# Patient Record
Sex: Male | Born: 1980 | Race: Asian | Hispanic: No | Marital: Married | Smoking: Former smoker
Health system: Southern US, Community
[De-identification: ages and names within clinical notes are randomized; demographics above are authoritative.]

## PROBLEM LIST (undated history)

## (undated) DIAGNOSIS — I517 Cardiomegaly: Secondary | ICD-10-CM

## (undated) DIAGNOSIS — R51 Headache: Secondary | ICD-10-CM

## (undated) DIAGNOSIS — R12 Heartburn: Secondary | ICD-10-CM

## (undated) DIAGNOSIS — K219 Gastro-esophageal reflux disease without esophagitis: Secondary | ICD-10-CM

## (undated) HISTORY — DX: Cardiomegaly: I51.7

---

## 2011-07-05 ENCOUNTER — Emergency Department (HOSPITAL_COMMUNITY)
Admission: EM | Admit: 2011-07-05 | Discharge: 2011-07-06 | Discharge status: Against Medical Advice | Payer: Self-pay | Attending: Emergency Medicine | Admitting: Emergency Medicine

## 2011-07-05 DIAGNOSIS — R12 Heartburn: Secondary | ICD-10-CM | POA: Insufficient documentation

## 2012-04-11 ENCOUNTER — Observation Stay (HOSPITAL_COMMUNITY)
Admission: EM | Admit: 2012-04-11 | Discharge: 2012-04-13 | Disposition: A | Discharge status: Home / Self Care | Payer: BC Managed Care – PPO | Source: Ambulatory Visit | Attending: Surgery | Admitting: Surgery

## 2012-04-11 ENCOUNTER — Encounter (HOSPITAL_COMMUNITY): Payer: Self-pay | Admitting: Emergency Medicine

## 2012-04-11 ENCOUNTER — Encounter (HOSPITAL_COMMUNITY): Payer: Self-pay | Admitting: *Deleted

## 2012-04-11 ENCOUNTER — Emergency Department (HOSPITAL_COMMUNITY): Payer: BC Managed Care – PPO

## 2012-04-11 ENCOUNTER — Emergency Department (HOSPITAL_COMMUNITY)
Admission: EM | Admit: 2012-04-11 | Discharge: 2012-04-11 | Disposition: A | Discharge status: Home / Self Care | Payer: BC Managed Care – PPO | Attending: Emergency Medicine | Admitting: Emergency Medicine

## 2012-04-11 DIAGNOSIS — F17200 Nicotine dependence, unspecified, uncomplicated: Secondary | ICD-10-CM | POA: Insufficient documentation

## 2012-04-11 DIAGNOSIS — K2970 Gastritis, unspecified, without bleeding: Secondary | ICD-10-CM | POA: Insufficient documentation

## 2012-04-11 DIAGNOSIS — Z23 Encounter for immunization: Secondary | ICD-10-CM | POA: Insufficient documentation

## 2012-04-11 DIAGNOSIS — K219 Gastro-esophageal reflux disease without esophagitis: Secondary | ICD-10-CM

## 2012-04-11 DIAGNOSIS — K2990 Gastroduodenitis, unspecified, without bleeding: Secondary | ICD-10-CM | POA: Insufficient documentation

## 2012-04-11 DIAGNOSIS — K8066 Calculus of gallbladder and bile duct with acute and chronic cholecystitis without obstruction: Secondary | ICD-10-CM

## 2012-04-11 DIAGNOSIS — R51 Headache: Secondary | ICD-10-CM | POA: Insufficient documentation

## 2012-04-11 DIAGNOSIS — K810 Acute cholecystitis: Principal | ICD-10-CM | POA: Insufficient documentation

## 2012-04-11 DIAGNOSIS — K8001 Calculus of gallbladder with acute cholecystitis with obstruction: Secondary | ICD-10-CM | POA: Diagnosis present

## 2012-04-11 DIAGNOSIS — K8050 Calculus of bile duct without cholangitis or cholecystitis without obstruction: Secondary | ICD-10-CM

## 2012-04-11 HISTORY — DX: Heartburn: R12

## 2012-04-11 HISTORY — DX: Gastro-esophageal reflux disease without esophagitis: K21.9

## 2012-04-11 HISTORY — DX: Headache: R51

## 2012-04-11 LAB — DIFFERENTIAL
Basophils Absolute: 0.1 K/uL (ref 0.0–0.1)
Basophils Relative: 0 % (ref 0–1)
Eosinophils Absolute: 0.1 K/uL (ref 0.0–0.7)
Eosinophils Relative: 1 % (ref 0–5)
Lymphocytes Relative: 20 % (ref 12–46)
Lymphs Abs: 2.5 K/uL (ref 0.7–4.0)
Monocytes Absolute: 0.8 10*3/uL (ref 0.1–1.0)
Monocytes Relative: 6 % (ref 3–12)
Neutro Abs: 9.1 10*3/uL — ABNORMAL HIGH (ref 1.7–7.7)
Neutrophils Relative %: 72 % (ref 43–77)

## 2012-04-11 LAB — CBC
HCT: 45.9 % (ref 39.0–52.0)
Hemoglobin: 15.9 g/dL (ref 13.0–17.0)
MCH: 29.8 pg (ref 26.0–34.0)
MCHC: 34.6 g/dL (ref 30.0–36.0)
MCV: 86.1 fL (ref 78.0–100.0)
Platelets: 237 K/uL (ref 150–400)
RBC: 5.33 MIL/uL (ref 4.22–5.81)
RDW: 13 % (ref 11.5–15.5)
WBC: 12.5 10*3/uL — ABNORMAL HIGH (ref 4.0–10.5)

## 2012-04-11 LAB — URINALYSIS, ROUTINE W REFLEX MICROSCOPIC
Bilirubin Urine: NEGATIVE
Glucose, UA: NEGATIVE mg/dL
Hgb urine dipstick: NEGATIVE
Ketones, ur: NEGATIVE mg/dL
Leukocytes, UA: NEGATIVE
Nitrite: NEGATIVE
Protein, ur: NEGATIVE mg/dL
Specific Gravity, Urine: 1.015 (ref 1.005–1.030)
Urobilinogen, UA: 0.2 mg/dL (ref 0.0–1.0)
pH: 7 (ref 5.0–8.0)

## 2012-04-11 LAB — COMPREHENSIVE METABOLIC PANEL WITH GFR
ALT: 44 U/L (ref 0–53)
AST: 22 U/L (ref 0–37)
Albumin: 4.3 g/dL (ref 3.5–5.2)
Alkaline Phosphatase: 56 U/L (ref 39–117)
Potassium: 3.9 meq/L (ref 3.5–5.1)
Sodium: 140 meq/L (ref 135–145)
Total Protein: 7.3 g/dL (ref 6.0–8.3)

## 2012-04-11 LAB — COMPREHENSIVE METABOLIC PANEL
BUN: 10 mg/dL (ref 6–23)
CO2: 28 mEq/L (ref 19–32)
Calcium: 9.6 mg/dL (ref 8.4–10.5)
Chloride: 101 mEq/L (ref 96–112)
Creatinine, Ser: 0.78 mg/dL (ref 0.50–1.35)
GFR calc Af Amer: >90 mL/min (ref >90)
GFR calc non Af Amer: >90 mL/min (ref >90)
Glucose, Bld: 110 mg/dL — ABNORMAL HIGH (ref 70–99)
Total Bilirubin: 0.6 mg/dL (ref 0.3–1.2)

## 2012-04-11 LAB — LIPASE, BLOOD: Lipase: 18 U/L (ref 11–59)

## 2012-04-11 LAB — SURGICAL PCR SCREEN: MRSA, PCR: NEGATIVE

## 2012-04-11 MED ORDER — FAMOTIDINE 20 MG PO TABS: 20.0000 mg | ORAL_TABLET | Freq: Two times a day (BID) | ORAL | Status: DC

## 2012-04-11 MED ORDER — HYDROMORPHONE HCL PF 1 MG/ML IJ SOLN
0.5000 mg | INTRAMUSCULAR | Status: DC | PRN
  Administered 2012-04-11 – 2012-04-12 (×3): 1 mg via INTRAVENOUS
  Filled 2012-04-11 (×3): qty 1

## 2012-04-11 MED ORDER — CHLORHEXIDINE GLUCONATE 0.12 % MT SOLN
15.0000 mL | Freq: Two times a day (BID) | OROMUCOSAL | Status: DC
  Administered 2012-04-12 – 2012-04-13 (×3): 15 mL via OROMUCOSAL
  Filled 2012-04-11 (×3): qty 15

## 2012-04-11 MED ORDER — BIOTENE DRY MOUTH MT LIQD
15.0000 mL | Freq: Two times a day (BID) | OROMUCOSAL | Status: DC
  Administered 2012-04-12: 15 mL via OROMUCOSAL

## 2012-04-11 MED ORDER — PANTOPRAZOLE SODIUM 40 MG PO TBEC
40.0000 mg | DELAYED_RELEASE_TABLET | Freq: Once | ORAL | Status: AC
  Administered 2012-04-11: 40 mg via ORAL
  Filled 2012-04-11: qty 1

## 2012-04-11 MED ORDER — FAMOTIDINE 20 MG PO TABS
20.0000 mg | ORAL_TABLET | Freq: Once | ORAL | Status: AC
  Administered 2012-04-11: 20 mg via ORAL
  Filled 2012-04-11: qty 1

## 2012-04-11 MED ORDER — HYDROMORPHONE HCL PF 1 MG/ML IJ SOLN
0.5000 mg | Freq: Once | INTRAMUSCULAR | Status: AC
  Administered 2012-04-11: 0.5 mg via INTRAVENOUS
  Filled 2012-04-11: qty 1

## 2012-04-11 MED ORDER — SODIUM CHLORIDE 0.9 % IV SOLN: 3.0000 g | Freq: Four times a day (QID) | INTRAVENOUS | Status: DC

## 2012-04-11 MED ORDER — SODIUM CHLORIDE 0.9 % IV SOLN
Freq: Once | INTRAVENOUS | Status: AC
  Administered 2012-04-11: 75 mL/h via INTRAVENOUS

## 2012-04-11 MED ORDER — ONDANSETRON HCL 4 MG/2ML IJ SOLN
4.0000 mg | Freq: Three times a day (TID) | INTRAMUSCULAR | Status: DC | PRN
  Administered 2012-04-11: 4 mg via INTRAVENOUS

## 2012-04-11 MED ORDER — HYDROMORPHONE HCL PF 1 MG/ML IJ SOLN: 1.0000 mg | INTRAMUSCULAR | Status: DC | PRN

## 2012-04-11 MED ORDER — PANTOPRAZOLE SODIUM 40 MG IV SOLR
40.0000 mg | Freq: Every day | INTRAVENOUS | Status: DC
  Administered 2012-04-11 – 2012-04-12 (×2): 40 mg via INTRAVENOUS
  Filled 2012-04-11 (×3): qty 40

## 2012-04-11 MED ORDER — PANTOPRAZOLE SODIUM 20 MG PO TBEC: 20.0000 mg | DELAYED_RELEASE_TABLET | Freq: Every day | ORAL | Status: DC

## 2012-04-11 MED ORDER — KCL IN DEXTROSE-NACL 20-5-0.45 MEQ/L-%-% IV SOLN
INTRAVENOUS | Status: DC
  Administered 2012-04-11 – 2012-04-12 (×2): via INTRAVENOUS
  Filled 2012-04-11 (×6): qty 1000

## 2012-04-11 MED ORDER — AMPICILLIN-SULBACTAM SODIUM 3 (2-1) G IJ SOLR
3.0000 g | Freq: Four times a day (QID) | INTRAMUSCULAR | Status: DC
  Administered 2012-04-11 – 2012-04-13 (×7): 3 g via INTRAVENOUS
  Filled 2012-04-11 (×10): qty 3

## 2012-04-11 MED ORDER — ONDANSETRON HCL 4 MG/2ML IJ SOLN
INTRAMUSCULAR | Status: AC
  Filled 2012-04-11: qty 2

## 2012-04-11 MED ORDER — HYDROMORPHONE HCL PF 1 MG/ML IJ SOLN
0.5000 mg | INTRAMUSCULAR | Status: DC | PRN
  Administered 2012-04-11: 0.5 mg via INTRAVENOUS
  Filled 2012-04-11: qty 1

## 2012-04-11 MED ORDER — ACETAMINOPHEN 325 MG PO TABS: 650.0000 mg | ORAL_TABLET | Freq: Four times a day (QID) | ORAL | Status: DC | PRN

## 2012-04-11 MED ORDER — GI COCKTAIL ~~LOC~~
30.0000 mL | Freq: Once | ORAL | Status: AC
  Administered 2012-04-11: 30 mL via ORAL
  Filled 2012-04-11: qty 30

## 2012-04-11 MED ORDER — PNEUMOCOCCAL VAC POLYVALENT 25 MCG/0.5ML IJ INJ
0.5000 mL | INJECTION | INTRAMUSCULAR | Status: AC
  Administered 2012-04-12: 0.5 mL via INTRAMUSCULAR
  Filled 2012-04-11: qty 0.5

## 2012-04-11 MED ORDER — ACETAMINOPHEN 650 MG RE SUPP: 650.0000 mg | Freq: Four times a day (QID) | RECTAL | Status: DC | PRN

## 2012-04-11 NOTE — ED Notes (Signed)
Pt claimed that his pain is better 

## 2012-04-11 NOTE — Discharge Instructions (Signed)
Please follow the diet care instructions attached. Take Pepcid twice a day for 10 days then every night. Take Protonix once a day at night. See the followup list above for the gastroenterologist should your symptoms continue. If you develop severe or worsening symptoms or pain or vomiting return to the hospital for a repeat evaluation.

## 2012-04-11 NOTE — ED Provider Notes (Signed)
History    This chart was scribed for Gavin Pound. Oletta Lamas, MD, MD by Smitty Pluck. The patient was seen in room STRE1 and the patient's care was started at 1:17AM.   CSN: [id number]  Arrival date & time 04/11/12  1220   First MD Initiated Contact with Patient 04/11/12 1242      Chief Complaint  Patient presents with  . Gastrophageal Reflux    (Consider location/radiation/quality/duration/timing/severity/associated sxs/prior treatment) The history is provided by the patient.   Steve Cooke is a 31 y.o. male who presents to the Emergency Department complaining of moderate intermittent gastrophageal reflex onset 1 day ago at [facility]. Pt reports that pain started 2 hours after eating (meal was at [employer]). He was in ED earlier this AM and was given GI cocktail without relief. Denies any abdominal surgeries. Pt reports hx of chronic reflex symptoms for past year. There is radiating to right upper back. Denies vomiting and fever.    Past Medical History  Diagnosis Date  . GERD (gastroesophageal reflux disease)   . Heartburn     History reviewed. No pertinent past surgical history.  History reviewed. No pertinent family history.  History  Substance Use Topics  . Smoking status: Current Everyday Smoker  . Smokeless tobacco: Not on file  . Alcohol Use: No      Review of Systems  Constitutional: Negative for fever, chills and fatigue.  Respiratory: Negative for cough and shortness of breath.   Cardiovascular: Negative for chest pain.  Gastrointestinal: Positive for abdominal pain. Negative for nausea, vomiting, diarrhea, constipation and blood in stool.  Genitourinary: Negative for dysuria, frequency, hematuria, enuresis and difficulty urinating.  All other systems reviewed and are negative.    Allergies  Review of patient's allergies indicates no known allergies.  Home Medications   Current Outpatient Rx  Name Route Sig Dispense Refill  . CALCIUM CARBONATE ANTACID 500 MG PO  CHEW Oral Chew 2 tablets by mouth every 8 (eight) hours as needed. Indigestion    . FAMOTIDINE 20 MG PO TABS Oral Take 1 tablet (20 mg total) by mouth 2 (two) times daily. 30 tablet 0  . NAPROXEN SODIUM 220 MG PO TABS Oral Take 440 mg by mouth daily as needed. For pain    . PANTOPRAZOLE SODIUM 20 MG PO TBEC Oral Take 1 tablet (20 mg total) by mouth daily. 30 tablet 1    BP 124/73  Pulse 60  Temp(Src) 98.2 F (36.8 C) (Oral)  Resp 18  SpO2 100%  Physical Exam  Nursing note and vitals reviewed. Constitutional: He is oriented to person, place, and time. He appears well-developed. No distress.  HENT:  Head: Normocephalic and atraumatic.  Eyes: Conjunctivae are normal. Pupils are equal, round, and reactive to light.  Neck: Normal range of motion. Neck supple.  Cardiovascular: Normal rate.   Pulmonary/Chest: Effort normal. No respiratory distress.  Abdominal: Soft. Normal appearance. There is tenderness (epigastric and RUQ) in the right upper quadrant and epigastric area. There is guarding. There is no rigidity, no rebound and no CVA tenderness.  Neurological: He is alert and oriented to person, place, and time.  Skin: Skin is warm and dry.  Psychiatric: He has a normal mood and affect. His behavior is normal.    ED Course  Procedures (including critical care time) DIAGNOSTIC STUDIES: Oxygen Saturation is 99% on room air, normal by my interpretation.    COORDINATION OF CARE: 1:24PM EDP discusses pt ED treatment with pt. Ordered medication: 0.9% NaCl infusion  Labs Reviewed  CBC - Abnormal; Notable for the following:    WBC 12.5 (*)    All other components within normal limits  DIFFERENTIAL - Abnormal; Notable for the following:    Neutro Abs 9.1 (*)    All other components within normal limits  COMPREHENSIVE METABOLIC PANEL - Abnormal; Notable for the following:    Glucose, Bld 110 (*)    All other components within normal limits  LIPASE, BLOOD  URINALYSIS, ROUTINE W  REFLEX MICROSCOPIC   US Abdomen Complete  04/11/2012  *RADIOLOGY REPORT*  Clinical Data:  Abdominal pain.  Gastroesophageal reflux.  The  COMPLETE ABDOMINAL ULTRASOUND  Comparison:  None.  Findings:  Gallbladder:  Shadowing echogenic stones measure up to 1.5 cm in the gallbladder neck.  Wall measures 6 mm.  No sonographic Murphy's sign.  Common bile duct:  4 mm, within normal limits.  Liver:  Left hepatic lobe is poorly visualized.  Remainder of the liver appears somewhat heterogeneous and coarsened.  IVC:  Appears normal.  Pancreas:  Obscured by bowel gas.  Spleen:  6.9 cm, negative.  Right Kidney:  11.1 cm with normal parenchymal echogenicity.  No hydronephrosis.  Left Kidney:  12.0 cm.  Normal parenchymal echogenicity.  No hydronephrosis.  A 2.3 x 1.8 x 2.1 cm anechoic lesion with increased through transmission is seen in the upper pole renal sinus and is most consistent with a cyst.  Abdominal aorta:  Proximal abdominal aorta measures 2.2 cm. Remainder of the abdominal aorta is obscured by bowel gas.  IMPRESSION:  1.  Midline structures are poorly visualized due to body habitus. 2.  Cholelithiasis and wall thickening.  Chronic cholecystitis could have this appearance. 3.  Suspect hepatic steatosis.  Original Report Authenticated By: Reyes Ivan, M.D.     1. Biliary colic     3:23 PM I reviewed U/S myself.  Pt  Is better, but still tender in upper epigastrium without overt Murphy's sign.  WBC is 12.5, not toxic appearing, but he is interested in having surgery to alleviate symptoms.  Pain has been present since 8 PM last night.  Plan is to consult general surgery to see pt.  MDM  I personally performed the services described in this documentation, which was scribed in my presence. The recorded information has been reviewed and considered.    Pt with tenderness in epigastrium and RUQ.  Constant since last night after eating Hort[employer]al.  Pt may have biliary colic, doesn't appear toxic,  will place in CDU for holding to obtain labs, U/S.  Likely will either need gsurg referral or consultation pending labs and U/S result.  Pt already has meds for GERD at home.  I reviewed his ED chart from earlier this AM.         Gavin Pound. Jamaine Quintin, MD 04/11/12 1525

## 2012-04-11 NOTE — ED Notes (Signed)
MD at bedside (general surgery) 

## 2012-04-11 NOTE — ED Notes (Signed)
MD Miller at bedside. 

## 2012-04-11 NOTE — H&P (Addendum)
Steve Cooke is an 31 y.o. male.   Chief Complaint: Return visit to the ED after discharge for reflux.  US shows thickened GB wall with stone HPI: The patient has had attacks previously and attributed them to reflux symptoms.  He was seen in the ED early this AM, sent home after evaluation demonstrated reflux symptoms.  Returned and US done which showed cholelithiasis and thickened GB wall.  Past Medical History  Diagnosis Date  . GERD (gastroesophageal reflux disease)   . Heartburn     History reviewed. No pertinent past surgical history.  History reviewed. No pertinent family history. Social History:  reports that he has been smoking.  He does not have any smokeless tobacco history on file. He reports that he does not drink alcohol or use illicit drugs.  Allergies: No Known Allergies   (Not in a hospital admission)  Results for orders placed during the hospital encounter of 04/11/12 (from the past 48 hour(s))  CBC     Status: Abnormal   Collection Time   04/11/12  1:29 PM      Component Value Range Comment   WBC 12.5 (*) 4.0 - 10.5 (K/uL)    RBC 5.33  4.22 - 5.81 (MIL/uL)    Hemoglobin 15.9  13.0 - 17.0 (g/dL)    HCT 64.4  03.4 - 74.2 (%)    MCV 86.1  78.0 - 100.0 (fL)    MCH 29.8  26.0 - 34.0 (pg)    MCHC 34.6  30.0 - 36.0 (g/dL)    RDW 59.5  63.8 - 75.6 (%)    Platelets 237  150 - 400 (K/uL)   DIFFERENTIAL     Status: Abnormal   Collection Time   04/11/12  1:29 PM      Component Value Range Comment   Neutrophils Relative 72  43 - 77 (%)    Neutro Abs 9.1 (*) 1.7 - 7.7 (K/uL)    Lymphocytes Relative 20  12 - 46 (%)    Lymphs Abs 2.5  0.7 - 4.0 (K/uL)    Monocytes Relative 6  3 - 12 (%)    Monocytes Absolute 0.8  0.1 - 1.0 (K/uL)    Eosinophils Relative 1  0 - 5 (%)    Eosinophils Absolute 0.1  0.0 - 0.7 (K/uL)    Basophils Relative 0  0 - 1 (%)    Basophils Absolute 0.1  0.0 - 0.1 (K/uL)   COMPREHENSIVE METABOLIC PANEL     Status: Abnormal   Collection Time   04/11/12   1:29 PM      Component Value Range Comment   Sodium 140  135 - 145 (mEq/L)    Potassium 3.9  3.5 - 5.1 (mEq/L)    Chloride 101  96 - 112 (mEq/L)    CO2 28  19 - 32 (mEq/L)    Glucose, Bld 110 (*) 70 - 99 (mg/dL)    BUN 10  6 - 23 (mg/dL)    Creatinine, Ser 4.33  0.50 - 1.35 (mg/dL)    Calcium 9.6  8.4 - 10.5 (mg/dL)    Total Protein 7.3  6.0 - 8.3 (g/dL)    Albumin 4.3  3.5 - 5.2 (g/dL)    AST 22  0 - 37 (U/L)    ALT 44  0 - 53 (U/L)    Alkaline Phosphatase 56  39 - 117 (U/L)    Total Bilirubin 0.6  0.3 - 1.2 (mg/dL)    GFR calc non Af  Amer >90  >90 (mL/min)    GFR calc Af Amer >90  >90 (mL/min)   LIPASE, BLOOD     Status: Normal   Collection Time   04/11/12  1:29 PM      Component Value Range Comment   Lipase 18  11 - 59 (U/L)   URINALYSIS, ROUTINE W REFLEX MICROSCOPIC     Status: Normal   Collection Time   04/11/12  3:46 PM      Component Value Range Comment   Color, Urine YELLOW  YELLOW     APPearance CLEAR  CLEAR     Specific Gravity, Urine 1.015  1.005 - 1.030     pH 7.0  5.0 - 8.0     Glucose, UA NEGATIVE  NEGATIVE (mg/dL)    Hgb urine dipstick NEGATIVE  NEGATIVE     Bilirubin Urine NEGATIVE  NEGATIVE     Ketones, ur NEGATIVE  NEGATIVE (mg/dL)    Protein, ur NEGATIVE  NEGATIVE (mg/dL)    Urobilinogen, UA 0.2  0.0 - 1.0 (mg/dL)    Nitrite NEGATIVE  NEGATIVE     Leukocytes, UA NEGATIVE  NEGATIVE  MICROSCOPIC NOT DONE ON URINES WITH NEGATIVE PROTEIN, BLOOD, LEUKOCYTES, NITRITE, OR GLUCOSE <1000 mg/dL.   US Abdomen Complete  04/11/2012  *RADIOLOGY REPORT*  Clinical Data:  Abdominal pain.  Gastroesophageal reflux.  The  COMPLETE ABDOMINAL ULTRASOUND  Comparison:  None.  Findings:  Gallbladder:  Shadowing echogenic stones measure up to 1.5 cm in the gallbladder neck.  Wall measures 6 mm.  No sonographic Murphy's sign.  Common bile duct:  4 mm, within normal limits.  Liver:  Left hepatic lobe is poorly visualized.  Remainder of the liver appears somewhat heterogeneous and  coarsened.  IVC:  Appears normal.  Pancreas:  Obscured by bowel gas.  Spleen:  6.9 cm, negative.  Right Kidney:  11.1 cm with normal parenchymal echogenicity.  No hydronephrosis.  Left Kidney:  12.0 cm.  Normal parenchymal echogenicity.  No hydronephrosis.  A 2.3 x 1.8 x 2.1 cm anechoic lesion with increased through transmission is seen in the upper pole renal sinus and is most consistent with a cyst.  Abdominal aorta:  Proximal abdominal aorta measures 2.2 cm. Remainder of the abdominal aorta is obscured by bowel gas.  IMPRESSION:  1.  Midline structures are poorly visualized due to body habitus. 2.  Cholelithiasis and wall thickening.  Chronic cholecystitis could have this appearance. 3.  Suspect hepatic steatosis.  Original Report Authenticated By: Reyes Ivan, M.D.    Review of Systems  Constitutional: Negative.   HENT: Negative.   Eyes: Negative.   Respiratory: Negative.   Cardiovascular: Negative.   Gastrointestinal: Positive for heartburn and nausea.  Genitourinary: Negative.   Musculoskeletal: Negative.   Skin: Negative.   Endo/Heme/Allergies: Negative.   Psychiatric/Behavioral: Negative.     Blood pressure 133/85, pulse 51, temperature 97.8 F (36.6 C), temperature source Oral, resp. rate 12, SpO2 100.00%. Physical Exam  Constitutional: He is oriented to person, place, and time. He appears well-developed and well-nourished.  HENT:  Head: Normocephalic and atraumatic.  Right Ear: External ear normal.  Eyes: Conjunctivae and EOM are normal. Pupils are equal, round, and reactive to light.  Neck: Normal range of motion. Neck supple.  Cardiovascular: Normal rate, regular rhythm and normal heart sounds.   Respiratory: Effort normal and breath sounds normal.  GI: Soft. Bowel sounds are decreased. There is tenderness in the right upper quadrant and epigastric area.  Musculoskeletal: Normal  range of motion.  Neurological: He is alert and oriented to person, place, and time. He has  normal reflexes.  Skin: Skin is warm and dry.  Psychiatric: He has a normal mood and affect. His behavior is normal. Judgment and thought content normal.     Assessment/Plan Cholelithiasis and cholecystitis  Admit for antibiotics and pain control To the OR tomorrow AM  WYATT III,JAMES O 04/11/2012, 4:25 PM  There has been no change in the patient's past medical history or physical exam in the past 24 hours to the best of my knowledge. I examined the patient in the holding area and have made any changes to the history and physical exam report that is included above.   Expectations and outcome results have been discussed with the patient to include risks and benefits.  All questions have been answered and we will proceed with previously discussed procedure noted and signed in the consent form in the patient's record.  Plan lap chole IOC  Samuella Rasool BMD FACS 9:01 AM  04/12/2012

## 2012-04-11 NOTE — ED Notes (Signed)
Patient has had intermittent indigestion since Sunday.  Patient states he has had increased sx after eating on yesterday.  He was seen at 0300 today and states his sx are no better with the prescribed medications

## 2012-04-11 NOTE — ED Provider Notes (Signed)
History     CSN: [id number]  Arrival date & time 04/11/12  0341   First MD Initiated Contact with Patient 04/11/12 [id number]      Chief Complaint  Patient presents with  . Heartburn    (Consider location/radiation/quality/duration/timing/severity/associated sxs/prior treatment) HPI Comments: 31 year old male with a history of acid reflux as has been diagnosed by multiple urgent care visits in the past for similar symptoms. He states that he has taken Protonix and TUMS and Alka-Seltzer in the past with some relief. Over the last 3 days he has had increased burning sensation with acid reflux that comes all the lip into the back of his throat, it is worse when he lays down, worse at nighttime and worse after eating. Over the last 24 hours she has developed more significant tenderness in his epigastrium as well as acid reflux symptoms. He has tried to take Alka-Seltzer and TUMS tonight without relief. He denies fevers chills nausea vomiting, dysuria, diarrhea, rash, cough. He has not had a family Dr. and has never seen a gastroenterologist. He does use frequent anti-inflammatory medications including aspirin and ibuprofen-containing medications.  Patient is a 31 y.o. male presenting with heartburn. The history is provided by the patient.  Heartburn    Past Medical History  Diagnosis Date  . GERD (gastroesophageal reflux disease)   . Heartburn     History reviewed. No pertinent past surgical history.  No family history on file.  History  Substance Use Topics  . Smoking status: Current Everyday Smoker  . Smokeless tobacco: Not on file  . Alcohol Use: No      Review of Systems  Gastrointestinal: Positive for heartburn.  All other systems reviewed and are negative.    Allergies  Review of patient's allergies indicates no known allergies.  Home Medications  No current outpatient prescriptions on file.  BP 140/89  Pulse 79  Temp(Src) 99 F (37.2 C) (Oral)  Resp 19  SpO2  99%  Physical Exam  Nursing note and vitals reviewed. Constitutional: He appears well-developed and well-nourished. No distress.  HENT:  Head: Normocephalic and atraumatic.  Mouth/Throat: Oropharynx is clear and moist. No oropharyngeal exudate.       Oropharynx is clear, mucous membranes are moist  Eyes: Conjunctivae and EOM are normal. Pupils are equal, round, and reactive to light. Right eye exhibits no discharge. Left eye exhibits no discharge. No scleral icterus.  Neck: Normal range of motion. Neck supple. No JVD present. No thyromegaly present.  Cardiovascular: Normal rate, regular rhythm, normal heart sounds and intact distal pulses.  Exam reveals no gallop and no friction rub.   No murmur heard. Pulmonary/Chest: Effort normal and breath sounds normal. No respiratory distress. He has no wheezes. He has no rales.  Abdominal: Soft. Bowel sounds are normal. He exhibits no distension and no mass. There is tenderness ( Mild epigastric tenderness to palpation without guarding, no right upper quadrant or lower abdominal tenderness, non-peritoneal).  Musculoskeletal: Normal range of motion. He exhibits no edema and no tenderness.  Lymphadenopathy:    He has no cervical adenopathy.  Neurological: He is alert. Coordination normal.  Skin: Skin is warm and dry. No rash noted. No erythema.  Psychiatric: He has a normal mood and affect. His behavior is normal.    ED Course  Procedures (including critical care time)  Labs Reviewed - No data to display No results found.   1. GERD (gastroesophageal reflux disease)   2. Gastritis       MDM  Overall the patient is well appearing with signs and symptoms consistent with acid reflux and a possible gastritis. He has risk factors of development of a peptic ulcer given his frequent NSAID use, I have cautioned him against ongoing use of these medications and we'll start him on both antihistamines H2 blockers as well as proton pump  inhibitors.  Patient has been able to express his verbal understanding of indications for return including severe abdominal pain or worsening symptoms. At this time his abdomen is soft and benign and I doubt that he has a perforated ulcer. His vital signs are completely normal. GI cocktail, Pepcid, Protonix given in the emergency department.  Discharge Prescriptions include:  protonix pepcid        Vida Roller, MD 04/11/12 [id number]

## 2012-04-11 NOTE — ED Notes (Signed)
Pt alert and oriented, with steady gait at time of discharge. Pt given discharge papers and papers explained. All questions answered and pt walked to discharge.  

## 2012-04-11 NOTE — ED Notes (Signed)
PT. REPORTS HEARTBURN / GERD ONSET THIS EVENING UNRELIEVED BY OTC ANTACIDS. DENIES NAUSEA.

## 2012-04-11 NOTE — ED Notes (Addendum)
General Surgery  came to examine the patient.

## 2012-04-12 ENCOUNTER — Encounter (HOSPITAL_COMMUNITY): Payer: Self-pay | Admitting: Certified Registered Nurse Anesthetist

## 2012-04-12 ENCOUNTER — Observation Stay (HOSPITAL_COMMUNITY): Payer: BC Managed Care – PPO | Admitting: Certified Registered Nurse Anesthetist

## 2012-04-12 ENCOUNTER — Encounter (HOSPITAL_COMMUNITY): Admission: EM | Disposition: A | Discharge status: Home / Self Care | Payer: Self-pay | Source: Ambulatory Visit

## 2012-04-12 ENCOUNTER — Observation Stay (HOSPITAL_COMMUNITY): Payer: BC Managed Care – PPO

## 2012-04-12 DIAGNOSIS — K8001 Calculus of gallbladder with acute cholecystitis with obstruction: Secondary | ICD-10-CM | POA: Diagnosis present

## 2012-04-12 HISTORY — PX: CHOLECYSTECTOMY: SHX55

## 2012-04-12 LAB — COMPREHENSIVE METABOLIC PANEL
AST: 19 U/L (ref 0–37)
Albumin: 3.3 g/dL — ABNORMAL LOW (ref 3.5–5.2)
Alkaline Phosphatase: 44 U/L (ref 39–117)
BUN: 8 mg/dL (ref 6–23)
Chloride: 101 mEq/L (ref 96–112)
Potassium: 3.7 mEq/L (ref 3.5–5.1)
Total Bilirubin: 0.9 mg/dL (ref 0.3–1.2)

## 2012-04-12 LAB — CBC
HCT: 41.6 % (ref 39.0–52.0)
Hemoglobin: 14.1 g/dL (ref 13.0–17.0)
MCH: 29.2 pg (ref 26.0–34.0)
MCHC: 33.3 g/dL (ref 30.0–36.0)
MCV: 87.6 fL (ref 78.0–100.0)
RBC: 4.83 MIL/uL (ref 4.22–5.81)
RDW: 13 % (ref 11.5–15.5)
WBC: 9.8 10*3/uL (ref 4.0–10.5)

## 2012-04-12 LAB — CREATININE, SERUM: Creatinine, Ser: 0.83 mg/dL (ref 0.50–1.35)

## 2012-04-12 SURGERY — LAPAROSCOPIC CHOLECYSTECTOMY WITH INTRAOPERATIVE CHOLANGIOGRAM
Anesthesia: General | Site: Abdomen | Wound class: Clean Contaminated

## 2012-04-12 MED ORDER — PROPOFOL 10 MG/ML IV BOLUS
INTRAVENOUS | Status: DC | PRN
  Administered 2012-04-12: 200 mg via INTRAVENOUS

## 2012-04-12 MED ORDER — HEPARIN SODIUM (PORCINE) 5000 UNIT/ML IJ SOLN
5000.0000 [IU] | Freq: Three times a day (TID) | INTRAMUSCULAR | Status: DC
  Administered 2012-04-13: 5000 [IU] via SUBCUTANEOUS
  Filled 2012-04-12 (×4): qty 1

## 2012-04-12 MED ORDER — MIDAZOLAM HCL 5 MG/5ML IJ SOLN
INTRAMUSCULAR | Status: DC | PRN
  Administered 2012-04-12: 2 mg via INTRAVENOUS

## 2012-04-12 MED ORDER — MEPERIDINE HCL 25 MG/ML IJ SOLN: 6.2500 mg | INTRAMUSCULAR | Status: DC | PRN

## 2012-04-12 MED ORDER — SODIUM CHLORIDE 0.9 % IR SOLN
Status: DC | PRN
  Administered 2012-04-12: 1000 mL

## 2012-04-12 MED ORDER — 0.9 % SODIUM CHLORIDE (POUR BTL) OPTIME
TOPICAL | Status: DC | PRN
  Administered 2012-04-12 (×2): 1000 mL

## 2012-04-12 MED ORDER — ROCURONIUM BROMIDE 100 MG/10ML IV SOLN
INTRAVENOUS | Status: DC | PRN
  Administered 2012-04-12: 10 mg via INTRAVENOUS
  Administered 2012-04-12 (×2): 5 mg via INTRAVENOUS
  Administered 2012-04-12 (×2): 10 mg via INTRAVENOUS
  Administered 2012-04-12: 50 mg via INTRAVENOUS
  Administered 2012-04-12: 10 mg via INTRAVENOUS

## 2012-04-12 MED ORDER — LACTATED RINGERS IV SOLN
INTRAVENOUS | Status: DC
  Administered 2012-04-12: 10:00:00 via INTRAVENOUS

## 2012-04-12 MED ORDER — HYDROMORPHONE HCL PF 1 MG/ML IJ SOLN
0.5000 mg | INTRAMUSCULAR | Status: DC | PRN
  Administered 2012-04-12 – 2012-04-13 (×4): 0.5 mg via INTRAVENOUS
  Filled 2012-04-12 (×4): qty 1

## 2012-04-12 MED ORDER — ONDANSETRON HCL 4 MG/2ML IJ SOLN
INTRAMUSCULAR | Status: DC | PRN
  Administered 2012-04-12: 4 mg via INTRAVENOUS

## 2012-04-12 MED ORDER — HYDROMORPHONE HCL PF 1 MG/ML IJ SOLN
0.2500 mg | INTRAMUSCULAR | Status: DC | PRN
  Administered 2012-04-12 (×3): 0.5 mg via INTRAVENOUS

## 2012-04-12 MED ORDER — LACTATED RINGERS IV SOLN
INTRAVENOUS | Status: DC | PRN
  Administered 2012-04-12 (×2): via INTRAVENOUS

## 2012-04-12 MED ORDER — HYDROMORPHONE HCL PF 1 MG/ML IJ SOLN
INTRAMUSCULAR | Status: AC
  Filled 2012-04-12: qty 1

## 2012-04-12 MED ORDER — KCL IN DEXTROSE-NACL 20-5-0.45 MEQ/L-%-% IV SOLN
INTRAVENOUS | Status: DC
  Administered 2012-04-12 – 2012-04-13 (×2): via INTRAVENOUS
  Filled 2012-04-12 (×5): qty 1000

## 2012-04-12 MED ORDER — METOCLOPRAMIDE HCL 5 MG/ML IJ SOLN
10.0000 mg | Freq: Once | INTRAMUSCULAR | Status: DC | PRN
  Filled 2012-04-12: qty 2

## 2012-04-12 MED ORDER — BUPIVACAINE HCL (PF) 0.25 % IJ SOLN
INTRAMUSCULAR | Status: DC | PRN
  Administered 2012-04-12: 10 mL

## 2012-04-12 MED ORDER — LIDOCAINE HCL (CARDIAC) 20 MG/ML IV SOLN
INTRAVENOUS | Status: DC | PRN
  Administered 2012-04-12: 100 mg via INTRAVENOUS

## 2012-04-12 MED ORDER — SODIUM CHLORIDE 0.9 % IV SOLN
INTRAVENOUS | Status: DC | PRN
  Administered 2012-04-12: 10:00:00

## 2012-04-12 MED ORDER — ALBUTEROL SULFATE HFA 108 (90 BASE) MCG/ACT IN AERS
INHALATION_SPRAY | RESPIRATORY_TRACT | Status: DC | PRN
  Administered 2012-04-12: 5 via RESPIRATORY_TRACT

## 2012-04-12 MED ORDER — FENTANYL CITRATE 0.05 MG/ML IJ SOLN
INTRAMUSCULAR | Status: DC | PRN
  Administered 2012-04-12: 100 ug via INTRAVENOUS
  Administered 2012-04-12: 150 ug via INTRAVENOUS
  Administered 2012-04-12 (×2): 50 ug via INTRAVENOUS

## 2012-04-12 MED ORDER — DEXAMETHASONE SODIUM PHOSPHATE 4 MG/ML IJ SOLN
INTRAMUSCULAR | Status: DC | PRN
  Administered 2012-04-12: 4 mg via INTRAVENOUS

## 2012-04-12 SURGICAL SUPPLY — 50 items
APPLICATOR COTTON TIP 6IN STRL (MISCELLANEOUS) ×4 IMPLANT
APPLIER CLIP 5 13 M/L LIGAMAX5 (MISCELLANEOUS) ×2
APPLIER CLIP ROT 10 11.4 M/L (STAPLE)
BENZOIN TINCTURE PRP APPL 2/3 (GAUZE/BANDAGES/DRESSINGS) ×2 IMPLANT
BLADE SURG ROTATE 9660 (MISCELLANEOUS) ×2 IMPLANT
CANISTER SUCTION 2500CC (MISCELLANEOUS) ×2 IMPLANT
CATH REDDICK CHOLANGI 4FR 50CM (CATHETERS) IMPLANT
CLIP APPLIE 5 13 M/L LIGAMAX5 (MISCELLANEOUS) ×1 IMPLANT
CLIP APPLIE ROT 10 11.4 M/L (STAPLE) IMPLANT
CLOTH BEACON ORANGE TIMEOUT ST (SAFETY) ×2 IMPLANT
COVER MAYO STAND STRL (DRAPES) ×2 IMPLANT
COVER SURGICAL LIGHT HANDLE (MISCELLANEOUS) ×2 IMPLANT
DECANTER SPIKE VIAL GLASS SM (MISCELLANEOUS) ×2 IMPLANT
DRAPE C-ARM 42X72 X-RAY (DRAPES) ×2 IMPLANT
ELECT REM PT RETURN 9FT ADLT (ELECTROSURGICAL) ×2
ELECTRODE REM PT RTRN 9FT ADLT (ELECTROSURGICAL) ×1 IMPLANT
GAUZE SPONGE 2X2 8PLY STRL LF (GAUZE/BANDAGES/DRESSINGS) ×1 IMPLANT
GLOVE BIO SURGEON STRL SZ7 (GLOVE) ×2 IMPLANT
GLOVE BIO SURGEON STRL SZ7.5 (GLOVE) ×4 IMPLANT
GLOVE BIO SURGEON STRL SZ8 (GLOVE) ×2 IMPLANT
GLOVE BIOGEL PI IND STRL 7.0 (GLOVE) ×3 IMPLANT
GLOVE BIOGEL PI IND STRL 7.5 (GLOVE) ×3 IMPLANT
GLOVE BIOGEL PI INDICATOR 7.0 (GLOVE) ×3
GLOVE BIOGEL PI INDICATOR 7.5 (GLOVE) ×3
GLOVE SURG SS PI 6.5 STRL IVOR (GLOVE) ×2 IMPLANT
GLOVE SURG SS PI 7.0 STRL IVOR (GLOVE) ×4 IMPLANT
GOWN PREVENTION PLUS XXLARGE (GOWN DISPOSABLE) IMPLANT
GOWN STRL NON-REIN LRG LVL3 (GOWN DISPOSABLE) ×2 IMPLANT
HEMOSTAT SURGICEL 2X14 (HEMOSTASIS) IMPLANT
IV CATH 14GX2 1/4 (CATHETERS) ×2 IMPLANT
KIT BASIN OR (CUSTOM PROCEDURE TRAY) ×2 IMPLANT
KIT ROOM TURNOVER OR (KITS) ×2 IMPLANT
NS IRRIG 1000ML POUR BTL (IV SOLUTION) ×2 IMPLANT
PAD ARMBOARD 7.5X6 YLW CONV (MISCELLANEOUS) ×2 IMPLANT
POUCH SPECIMEN RETRIEVAL 10MM (ENDOMECHANICALS) ×2 IMPLANT
SCISSORS LAP 5X35 DISP (ENDOMECHANICALS) ×2 IMPLANT
SET IRRIG TUBING LAPAROSCOPIC (IRRIGATION / IRRIGATOR) ×2 IMPLANT
SLEEVE ENDOPATH XCEL 5M (ENDOMECHANICALS) ×4 IMPLANT
SPECIMEN JAR SMALL (MISCELLANEOUS) ×2 IMPLANT
SPONGE GAUZE 2X2 STER 10/PKG (GAUZE/BANDAGES/DRESSINGS) ×1
SUT VIC AB 4-0 SH 18 (SUTURE) ×2 IMPLANT
SUT VICRYL 0 UR6 27IN ABS (SUTURE) IMPLANT
TIP RIGID 35CM EVICEL (HEMOSTASIS) IMPLANT
TOWEL OR 17X24 6PK STRL BLUE (TOWEL DISPOSABLE) IMPLANT
TOWEL OR 17X26 10 PK STRL BLUE (TOWEL DISPOSABLE) IMPLANT
TRAY LAPAROSCOPIC (CUSTOM PROCEDURE TRAY) ×2 IMPLANT
TROCAR XCEL BLUNT TIP 100MML (ENDOMECHANICALS) IMPLANT
TROCAR XCEL NON-BLD 11X100MML (ENDOMECHANICALS) ×2 IMPLANT
TROCAR XCEL NON-BLD 5MMX100MML (ENDOMECHANICALS) ×2 IMPLANT
WATER STERILE IRR 1000ML POUR (IV SOLUTION) IMPLANT

## 2012-04-12 NOTE — Op Note (Signed)
Steve Cooke @date @  Procedure: Laparoscopic Cholecystectomy with intraoperative cholangiogram  Surgeon: Wenda Low, MD, FACS Asst:  None  Anes:  General  Drains: None  Findings: Subacute and acute cholecystitis with normal intraoperative cholangiogram. For time 2 hours  Description of Procedure: The patient was taken to OR 17 and given general anesthesia.  The patient was prepped with PCMX and draped sterilely. A time out was performed.  Access to the abdomen was achieved with 5 mm Optiview through the right upper quadrant.  Port placement included a 12 in the upper midline and 3 of the 5 mm trochars.    The gallbladder was visualized and the fundus was grasped and the gallbladder was elevated. Traction on the infundibulum allowed for successful demonstration of the critical view. Inflammatory changes were severe and numerous adhesions were taken down to just get the infundibulum and a position to be visualized. I worked for a while to strip away the dense inflammatory adhesions about the fundus. This portion was bloody. I took down some fat that was stuck up to the liver and was able to get finally a view of the infundibulum. There is no evidence of any injury to the duodenum and I did not use electrocautery when I was doing this. He was mainly blunt dissection with the irrigation device. Eventually identified a cystic duct which came off and went more vertically and was appeared to be longer. I did not see an artery therefore did not get a critical view..  The cystic duct was identified and clipped up on the gallbladder and an incision was made in the cystic duct and the Reddick catheter was inserted after milking the cystic duct of any debris.  Mini impacted yellow stones were retrieved from the cystic duct with the milking maneuver. A dynamic cholangiogram was performed which demonstrated free flow into the duodenum, a small common bile duct with good intrahepatic radicals..    The cystic duct  was then triple clipped and divided, the cystic artery was double clipped and divided and then the gallbladder was removed from the gallbladder bed. Removal of the gallbladder from the gallbladder bed was accomplished without entering it. I used a hook electrocautery and once detached there was no bleeding or bile leaks noted. I placed in a bag and retrieved it through the upper 1011 incision. I had a large incision because of the large stones that were in the gallbladder..  The gallbladder was then placed in a bag and brought out through one of the 10 mm trocar sites. The gallbladder bed was inspected and no bleeding or bile leaks were seen.   Laparoscopic visualization was used when closing fascial defects for trocar sites.   Incisions were injected with Marcaine and closed with 4-0 Vicryl and Steri-Strips on the skin.  Sponge and needle count were correct.    The patient was taken to the recovery room in satisfactory condition.

## 2012-04-12 NOTE — Preoperative (Signed)
Beta Blockers   Reason not to administer Beta Blockers:Not Applicable 

## 2012-04-12 NOTE — Anesthesia Preprocedure Evaluation (Signed)
Anesthesia Evaluation  Patient identified by MRN, date of birth, ID band Patient awake    Reviewed: Allergy & Precautions, H&P , NPO status , Patient's Chart, lab work & pertinent test results  Airway Mallampati: III TM Distance: >3 FB Neck ROM: Full    Dental No notable dental hx. (+) Teeth Intact, Poor Dentition, Caps and Chipped,    Pulmonary Current Smoker,  breath sounds clear to auscultation  Pulmonary exam normal       Cardiovascular negative cardio ROS  Rhythm:Regular Rate:Normal     Neuro/Psych  Headaches, negative psych ROS   GI/Hepatic Neg liver ROS, GERD-  Medicated,Cholelithiasis with cholecystitis   Endo/Other  Morbid obesity  Renal/GU negative Renal ROS  negative genitourinary   Musculoskeletal negative musculoskeletal ROS (+)   Abdominal (+) + obese,  Abdomen: soft and tender.    Peds  Hematology negative hematology ROS (+)   Anesthesia Other Findings   Reproductive/Obstetrics                           Anesthesia Physical Anesthesia Plan  ASA: III  Anesthesia Plan: General   Post-op Pain Management:    Induction:   Airway Management Planned: Oral ETT  Additional Equipment:   Intra-op Plan:   Post-operative Plan: Extubation in OR  Informed Consent: I have reviewed the patients History and Physical, chart, labs and discussed the procedure including the risks, benefits and alternatives for the proposed anesthesia with the patient or authorized representative who has indicated his/her understanding and acceptance.   Dental advisory given  Plan Discussed with: CRNA, Anesthesiologist and Surgeon  Anesthesia Plan Comments:         Anesthesia Quick Evaluation

## 2012-04-12 NOTE — Anesthesia Postprocedure Evaluation (Signed)
  Anesthesia Post-op Note  Patient: Steve Cooke  Procedure(s) Performed: Procedure(s) (LRB): LAPAROSCOPIC CHOLECYSTECTOMY WITH INTRAOPERATIVE CHOLANGIOGRAM (N/A)  Patient Location: PACU  Anesthesia Type: General  Level of Consciousness: awake, alert  and oriented  Airway and Oxygen Therapy: Patient Spontanous Breathing  Post-op Pain: mild  Post-op Assessment: Post-op Vital signs reviewed, Patient's Cardiovascular Status Stable, Respiratory Function Stable, Patent Airway, No signs of Nausea or vomiting and Pain level controlled  Post-op Vital Signs: Reviewed and stable  Complications: No apparent anesthesia complications

## 2012-04-12 NOTE — Transfer of Care (Signed)
Immediate Anesthesia Transfer of Care Note  Patient: Steve Cooke  Procedure(s) Performed: Procedure(s) (LRB): LAPAROSCOPIC CHOLECYSTECTOMY WITH INTRAOPERATIVE CHOLANGIOGRAM (N/A)  Patient Location: PACU  Anesthesia Type: General  Level of Consciousness: awake, alert  and patient cooperative  Airway & Oxygen Therapy: Patient Spontanous Breathing and Patient connected to face mask oxygen  Post-op Assessment: Report given to PACU RN, Post -op Vital signs reviewed and stable and Patient moving all extremities X 4  Post vital signs: Reviewed and stable  Complications: No apparent anesthesia complications

## 2012-04-13 LAB — COMPREHENSIVE METABOLIC PANEL
ALT: 43 U/L (ref 0–53)
Alkaline Phosphatase: 42 U/L (ref 39–117)
BUN: 8 mg/dL (ref 6–23)
CO2: 31 mEq/L (ref 19–32)
Chloride: 102 mEq/L (ref 96–112)
GFR calc Af Amer: >90 mL/min (ref >90)
GFR calc non Af Amer: >90 mL/min (ref >90)
Glucose, Bld: 109 mg/dL — ABNORMAL HIGH (ref 70–99)
Potassium: 4 mEq/L (ref 3.5–5.1)
Sodium: 140 mEq/L (ref 135–145)
Total Bilirubin: 1.1 mg/dL (ref 0.3–1.2)

## 2012-04-13 LAB — CBC
HCT: 39.5 % (ref 39.0–52.0)
MCHC: 32.4 g/dL (ref 30.0–36.0)
Platelets: 209 10*3/uL (ref 150–400)
RDW: 13.2 % (ref 11.5–15.5)
WBC: 10.2 10*3/uL (ref 4.0–10.5)

## 2012-04-13 MED ORDER — OXYCODONE-ACETAMINOPHEN 5-325 MG PO TABS: 1.0000 | ORAL_TABLET | ORAL | Status: AC | PRN

## 2012-04-13 MED ORDER — AMOXICILLIN-POT CLAVULANATE 875-125 MG PO TABS: 1.0000 | ORAL_TABLET | Freq: Two times a day (BID) | ORAL | Status: AC

## 2012-04-13 MED ORDER — PANTOPRAZOLE SODIUM 40 MG PO TBEC
40.0000 mg | DELAYED_RELEASE_TABLET | Freq: Every day | ORAL | Status: DC
  Filled 2012-04-13: qty 1

## 2012-04-13 MED ORDER — OXYCODONE-ACETAMINOPHEN 5-325 MG PO TABS
1.0000 | ORAL_TABLET | ORAL | Status: DC | PRN
  Administered 2012-04-13 (×2): 2 via ORAL
  Filled 2012-04-13 (×2): qty 2

## 2012-04-13 NOTE — Discharge Instructions (Signed)
[facility] ______[facility] °LAPAROSCOPIC SURGERY: POST OP INSTRUCTIONS °Always review your discharge instruction sheet given to you by the facility where your surgery was performed. °IF YOU HAVE DISABILITY OR FAMILY LEAVE FORMS, YOU MUST BRING THEM TO THE OFFICE FOR PROCESSING.   °DO NOT GIVE THEM TO YOUR DOCTOR. ° °1. A prescription for pain medication may be given to you upon discharge.  Take your pain medication as prescribed, if needed.  If narcotic pain medicine is not needed, then you may take acetaminophen (Tylenol) or ibuprofen (Advil) as needed. °2. Take your usually prescribed medications unless otherwise directed. °3. If you need a refill on your pain medication, please contact your pharmacy.  They will contact our office to request authorization. Prescriptions will not be filled after 5pm or on week-ends. °4. You should follow a light diet the first few days after arrival home, such as soup and crackers, etc.  Be sure to include lots of fluids daily. °5. Most patients will experience some swelling and bruising in the area of the incisions.  Ice packs will help.  Swelling and bruising can take several days to resolve.  °6. It is common to experience some constipation if taking pain medication after surgery.  Increasing fluid intake and taking a stool softener (such as Colace) will usually help or prevent this problem from occurring.  A mild laxative (Milk of Magnesia or Miralax) should be taken according to package instructions if there are no bowel movements after 48 hours. °7. Unless discharge instructions indicate otherwise, you may remove your bandages 24-48 hours after surgery, and you may shower at that time.  You may have steri-strips (small skin tapes) in place directly over the incision.  These strips should be left on the skin for 7-10 days.  If your surgeon used skin glue on the incision, you may shower in 24 hours.  The glue will flake off over the next 2-3 weeks.  Any sutures or  staples will be removed at the office during your follow-up visit. °8. ACTIVITIES:  You may resume regular (light) daily activities beginning the next day--such as daily self-care, walking, climbing stairs--gradually increasing activities as tolerated.  You may have sexual intercourse when it is comfortable.  Refrain from any heavy lifting or straining until approved by your doctor. °a. You may drive when you are no longer taking prescription pain medication, you can comfortably wear a seatbelt, and you can safely maneuver your car and apply brakes. °b. RETURN TO WORK:  __________________________________________________________ °9. You should see your doctor in the office for a follow-up appointment approximately 2-3 weeks after your surgery.  Make sure that you call for this appointment within a day or two after you arrive home to insure a convenient appointment time. °10. OTHER INSTRUCTIONS: __________________________________________________________________________________________________________________________ __________________________________________________________________________________________________________________________ °WHEN TO CALL YOUR DOCTOR: °1. Fever over 101.0 °2. Inability to urinate °3. Continued bleeding from incision. °4. Increased pain, redness, or drainage from the incision. °5. Increasing abdominal pain ° °The clinic staff is available to answer your questions during regular business hours.  Please don’t hesitate to call and ask to speak to one of the nurses for clinical concerns.  If you have a medical emergency, go to the nearest emergency room or call 911.  A surgeon from [facility] is always on call at the hospital. °[address], [address], [place], [place]  [address] ? [address] [address], [place], [place]   [address] °[phone] ? [phone] ? FAX [phone] °Web site:   [email or web address] °

## 2012-04-13 NOTE — Progress Notes (Signed)
Discharge instructions/Med Rec Sheet reviewed w/ pt. Pt expressed understanding and copies given w/ prescriptions. Pt d/c'd in stable condition via w/c, accompanied by discharge volunteers 

## 2012-04-13 NOTE — Discharge Summary (Signed)
  Patient ID: Javar Eshbach MRN: [id number] DOB/AGE: 04-07-1981 31 y.o.  Admit date: 04/11/2012 Discharge date: 04/13/2012  Procedures: lap chole with placement of JP drain  Consults: None  Reason for Admission: This is a 31 yo male who presented to the [facility] with RUQ and epigastric abdominal pain.  He was found to have acute cholecystitis.  Admission Diagnoses:  1. Acute cholecystitis  Hospital Course: The patient was admitted and taken to the operating room where he underwent a lap chole.  He tolerated the procedure well.  He did have a JP drain placed.  On POD# 1, he was tolerating a regular diet.  If he can tolerate po pain meds and ambulate well we will let him go home today.  We will dc his JP drain at this time.  PE: Abd: soft, appropriately tender, incisions c/d/i, JP with just serosanguinous output.  Discharge Diagnoses:  Active Problems:  Acute and subacute cholecystitis with obstructed cystic duct s/p lap chole  Discharge Medications: Medication List  As of 04/13/2012  9:44 AM   TAKE these medications         ALEVE 220 MG tablet   Generic drug: naproxen sodium   Take 440 mg by mouth daily as needed. For pain      amoxicillin-clavulanate 875-125 MG per tablet   Commonly known as: AUGMENTIN   Take 1 tablet by mouth 2 (two) times daily.      calcium carbonate 500 MG chewable tablet   Commonly known as: TUMS - dosed in mg elemental calcium   Chew 2 tablets by mouth every 8 (eight) hours as needed. Indigestion      famotidine 20 MG tablet   Commonly known as: PEPCID   Take 1 tablet (20 mg total) by mouth 2 (two) times daily.      oxyCODONE-acetaminophen 5-325 MG per tablet   Commonly known as: PERCOCET   Take 1-2 tablets by mouth every 4 (four) hours as needed.      pantoprazole 20 MG tablet   Commonly known as: PROTONIX   Take 1 tablet (20 mg total) by mouth daily.            Discharge Instructions: Follow-up Information    Follow up with Luretha Murphy B,  MD. Schedule an appointment as soon as possible for a visit in 3 weeks.   Contact information:   [employer] [address] [place] [address] [phone]          Signed: Letha Cape 04/13/2012, 9:44 AM

## 2012-04-17 ENCOUNTER — Telehealth (INDEPENDENT_AMBULATORY_CARE_PROVIDER_SITE_OTHER): Payer: Self-pay | Admitting: General Surgery

## 2012-04-17 ENCOUNTER — Encounter (HOSPITAL_COMMUNITY): Payer: Self-pay | Admitting: Surgery

## 2012-04-17 NOTE — Telephone Encounter (Signed)
Called patient to advise of appointment set up with Dr. Daphine Deutscher on 04/25/12 at 9:50. Patient agreed.

## 2012-04-25 ENCOUNTER — Ambulatory Visit (INDEPENDENT_AMBULATORY_CARE_PROVIDER_SITE_OTHER): Payer: BC Managed Care – PPO | Admitting: Surgery

## 2012-04-25 ENCOUNTER — Encounter (INDEPENDENT_AMBULATORY_CARE_PROVIDER_SITE_OTHER): Payer: Self-pay | Admitting: Surgery

## 2012-04-25 VITALS — BP 122/80 | HR 68 | Temp 97.4°F | Resp 14 | Ht 70.0 in | Wt 256.0 lb

## 2012-04-25 DIAGNOSIS — K8001 Calculus of gallbladder with acute cholecystitis with obstruction: Secondary | ICD-10-CM

## 2012-04-25 NOTE — Patient Instructions (Signed)
Return for any problems or pains.

## 2012-04-25 NOTE — Progress Notes (Signed)
Steve Cooke 31 y.o.  Body mass index is 36.73 kg/(m^2).  Patient Active Problem List  Diagnoses  . Acute and subacute cholecystitis with obstructed cystic duct    No Known Allergies  Past Surgical History  Procedure Date  . No past surgeries   . Cholecystectomy 04/12/2012    Procedure: LAPAROSCOPIC CHOLECYSTECTOMY WITH INTRAOPERATIVE CHOLANGIOGRAM;  Surgeon: Valarie Merino, MD;  Location: [facility] OR;  Service: General;  Laterality: N/A;   DEFAULT,PROVIDER, MD, MD No diagnosis found.  Incisions have healed.  Had a bad cholecystitis.  Return to work at [employer].  Will see prn. Matt B. Daphine Deutscher, MD, [facility] [phone] beeper [phone]  04/25/2012 10:35 AM

## 2013-05-28 ENCOUNTER — Ambulatory Visit: Payer: BC Managed Care – PPO

## 2013-05-28 ENCOUNTER — Ambulatory Visit (INDEPENDENT_AMBULATORY_CARE_PROVIDER_SITE_OTHER): Payer: BC Managed Care – PPO | Admitting: Family Medicine

## 2013-05-28 VITALS — BP 132/88 | HR 73 | Temp 98.2°F | Resp 18 | Ht 70.5 in | Wt 271.0 lb

## 2013-05-28 DIAGNOSIS — M79609 Pain in unspecified limb: Secondary | ICD-10-CM

## 2013-05-28 DIAGNOSIS — M79604 Pain in right leg: Secondary | ICD-10-CM

## 2013-05-28 DIAGNOSIS — S8010XA Contusion of unspecified lower leg, initial encounter: Secondary | ICD-10-CM

## 2013-05-28 DIAGNOSIS — S8011XA Contusion of right lower leg, initial encounter: Secondary | ICD-10-CM

## 2013-05-28 NOTE — Patient Instructions (Addendum)
Compartment Syndrome  Arms and legs are made up of muscles, nerves and blood vessels. These are wrapped in tough layers of tissue, called fascia. This creates separate spaces, or compartments. Sometimes, something goes wrong. Sometimes the problem is an injury. Swelling and pressure develop in a compartment. The pressure buildup is called compartment syndrome. This can become a serious problem very quickly.  The fascia does not stretch very much. The pressure has no way to get out. In time, the pressure will push on whatever is inside the compartment. When a muscle in the compartment moves, you will feel severe pain. If pressure continues to increase, it can block the flow of blood in the smallest blood vessels, the capillaries. Then, the nerves and muscles in the compartment cannot get enough oxygen and nutrients (substances needed for survival). They will start to die. That is why the pressure needs to be relieved immediately.   CAUSES   Various things can lead to compartment syndrome. Possible causes include:   Injury. Some injuries can cause swelling or bleeding in a compartment. This can lead to compartment syndrome. Injuries that may cause this problem include:   Broken bones.   Crushing injuries.   Something punctures the skin and tissue underneath (penetrating injuries). An example is a knife wound.   Badly bruised muscles.   Poisonous bites (snake).   Severe burns.   Blocked blood flow. This could result from:   A cast or bandage that is too tight.   A surgical procedure. Blood flow sometimes has to be stopped for a while during an operation, usually with a tourniquet.   Lying for too long in a position that restricts blood flow. This can happen in people who have nerve damage. They might not feel tingling or pain. It also can occur in people who are very drunk or who have overdosed on drugs.   Drugs used to build up muscles (anabolic steroids).   Drugs that keep the blood from forming clots  (blood-thinners).  SYMPTOMS   Compartment syndrome usually develops in the lower leg. It also can occur in the upper leg, arms, hands, feet and buttocks.    The most common symptom is pain:   Pain that gets worse when moving or stretching.   Pain that is worse than it should be for an injury.   Pain that comes along with a feeling of tingling or burning.   Pain that is worse when the area is pushed or squeezed.   Pain that is unaffected by pain medicine.   Other symptoms include:   A feeling of tightness or fullness.   A loss of feeling.   Weakness in the area.   Loss of movement.   Skin that becomes pale, tight and shiny over the painful area.  DIAGNOSIS   To decide if you have compartment syndrome, your healthcare provider will probably:   Do a physical exam. This will include questions about any recent injury (trauma).   Measure the pressure inside the compartment. This is a very important test. A needle is put through the skin and into the compartment. The needle is attached to a meter. The device will show if pressure is building. It also will show how high the pressure has gotten.   Order other tests, such as:   X-ray. This will show bone damage or a fracture.   Blood tests. They can tell if muscles are damaged.   Ultrasound. This test uses sounds waves to create a picture   inside the painful area.  TREATMENT   Compartment syndrome can be a surgical emergency. It should be treated very quickly.    First-aid treatment is given first. This would include:   Promptly treating the injury.   Loosening or removing any cast, bandage, or external wrap that may be causing pain.   Raising the painful arm or leg to the same level as the heart.   Giving oxygen.   Giving fluid through a plastic tube that is put into a vein in the hand or arm (intravenous line or IV).   Surgery to treat compartment syndrome is call a fasciotomy. This is needed to relieve the pressure. It also can help prevent  permanent damage.   A cut (incision) is made through the skin over the painful area.   Long incisions are made through the fascia. This relieves the pressure in the compartment.   The incisions may be left open. They would be covered with medicine and a bandage (dressing). After 2 to 3 days, the skin would be closed. Sometimes a special suction dressing called a VAC is applied for a few days to remove fluid before closing the wound.   Sometimes a skin graft is needed. Skin over the incision would be replaced with skin from another part of the body.  PROGNOSIS   Serious problems can result from compartment syndrome. You might not be able to use the arm or leg like you once did. Sometimes feeling goes away. Severe cases could lead to loss of the limb. Identifying the condition early and treating it quickly can prevent most problems. Then, the outlook for a full recovery is very good.  Document Released: 10/26/2009 Document Revised: 01/30/2012 Document Reviewed: 10/26/2009  Exit[employer] Patient Information 2014 [employer].

## 2013-05-28 NOTE — Progress Notes (Signed)
  Subjective:    Patient ID: Steve Cooke, male    DOB: Jul 03, 1981, 32 y.o.   MRN: [id number]  HPI Steve Cooke is a 32 y.o. male presenting for right leg pain since the night of July 4th.  He fell on a headstone and then the headstone fell on his right calf, it took 4 people to remove the headstone off his leg.  Over the weekend, he experienced pain, soreness, and bruising.  He was limping but able to walk.  Yesterday, it began to swell - this is their concern.  He has used tylenol and heat some.  Denies redness or heat in the area.  He also denies knee pain, ankle pain, and foot pain.  He has noticed no change in his urine color or feeling is his feet.  Past medical history, surgical history, medications, allergies, social history and family history have been reviewed.  Review of Systems As stated in HPI - otherwise negative.     Objective:   Physical Exam Filed Vitals:   05/28/13 0929  BP: 132/88  Pulse: 73  Temp: 98.2 F (36.8 C)  TempSrc: Oral  Resp: 18  Height: 5' 10.5" (1.791 m)  Weight: 271 lb (122.925 kg)  SpO2: 98%   Skin:  Large ecchymosis located on right anterior calf in a square shape at the upper outer edge.  Edema of lower leg extending into right foot.  No erythema or warmth over the ecchymosis. MSK:  5/5 strength.  Tenderness to palpation over ecchymosis and tibia.  No calf tenderness, knee pain, or ankle/foot pain. PVS:  2+ pedal pulses.  Good cap refill.    X-Ray reading by Dr. Conley Rolls.  Preliminary report:  No bony abnormalities.  Soft tissue swelling.  Mild pre-tibial hematoma.      Assessment & Plan:  1. Leg pain, anterior, right 2. Contusion of leg, right, initial encounter  With a normal xray and only TTP over the ecchymosis it appears that patient has a significant contusion to the tibia without infection or compartment syndrome. We expect the injury to continue to heal and swelling should resolve over the next several days.  He will increase movement in his leg  to improve swelling more quickly.  high-dose ibuprofen (800 mg) or acetaminophen for pain and inflammation.  Rotate heat and ice.  Keep right leg elevated.    Come directly in if any acute worsening occurs - including increased swelling, redness, tightness, pain in calf, toe pain, change in color to foot/toes.  Patient education on compartment syndrome provided.  If patient motices a change in urine color he should RTC.  Order: - DG Tibia/Fibula Right; Future

## 2013-06-09 ENCOUNTER — Encounter (HOSPITAL_COMMUNITY): Payer: Self-pay | Admitting: *Deleted

## 2013-06-09 ENCOUNTER — Emergency Department (HOSPITAL_COMMUNITY)
Admission: EM | Admit: 2013-06-09 | Discharge: 2013-06-10 | Disposition: A | Discharge status: Home / Self Care | Payer: BC Managed Care – PPO | Attending: Emergency Medicine | Admitting: Emergency Medicine

## 2013-06-09 DIAGNOSIS — Z79899 Other long term (current) drug therapy: Secondary | ICD-10-CM | POA: Insufficient documentation

## 2013-06-09 DIAGNOSIS — L539 Erythematous condition, unspecified: Secondary | ICD-10-CM | POA: Insufficient documentation

## 2013-06-09 DIAGNOSIS — Z8719 Personal history of other diseases of the digestive system: Secondary | ICD-10-CM | POA: Insufficient documentation

## 2013-06-09 DIAGNOSIS — F17200 Nicotine dependence, unspecified, uncomplicated: Secondary | ICD-10-CM | POA: Insufficient documentation

## 2013-06-09 DIAGNOSIS — L988 Other specified disorders of the skin and subcutaneous tissue: Secondary | ICD-10-CM | POA: Insufficient documentation

## 2013-06-09 DIAGNOSIS — R21 Rash and other nonspecific skin eruption: Secondary | ICD-10-CM | POA: Insufficient documentation

## 2013-06-09 DIAGNOSIS — R609 Edema, unspecified: Secondary | ICD-10-CM | POA: Insufficient documentation

## 2013-06-09 DIAGNOSIS — L299 Pruritus, unspecified: Secondary | ICD-10-CM | POA: Insufficient documentation

## 2013-06-09 DIAGNOSIS — M7989 Other specified soft tissue disorders: Secondary | ICD-10-CM | POA: Insufficient documentation

## 2013-06-09 MED ORDER — PREDNISONE 20 MG PO TABS
60.0000 mg | ORAL_TABLET | Freq: Once | ORAL | Status: AC
  Administered 2013-06-09: 60 mg via ORAL
  Filled 2013-06-09: qty 3

## 2013-06-09 MED ORDER — DIPHENHYDRAMINE HCL 25 MG PO CAPS
50.0000 mg | ORAL_CAPSULE | Freq: Once | ORAL | Status: AC
  Administered 2013-06-09: 50 mg via ORAL
  Filled 2013-06-09: qty 2

## 2013-06-09 MED ORDER — CEPHALEXIN 500 MG PO CAPS: 500.0000 mg | ORAL_CAPSULE | Freq: Four times a day (QID) | ORAL | Status: DC

## 2013-06-09 MED ORDER — CEPHALEXIN 250 MG PO CAPS
250.0000 mg | ORAL_CAPSULE | Freq: Once | ORAL | Status: AC
  Administered 2013-06-09: 250 mg via ORAL
  Filled 2013-06-09: qty 1

## 2013-06-09 MED ORDER — PREDNISONE 10 MG PO TABS: 20.0000 mg | ORAL_TABLET | Freq: Every day | ORAL | Status: DC

## 2013-06-09 NOTE — ED Provider Notes (Signed)
   History    CSN: [id number] Arrival date & time 06/09/13  2220  First MD Initiated Contact with Patient 06/09/13 2304     Chief Complaint  Patient presents with  . Leg Pain    rt   (Consider location/radiation/quality/duration/timing/severity/associated sxs/prior Treatment) HPI  32 y.o. Male with erythema and itching his right lower leg.  He states he had a bruise to the lateral aspect of the rle when a stone fell on it July4.  He noted increased redness spreading medially several days later, now with itching and skin thickening with some weepy discharge of clear fluid.  Patient was seen at an urgent care and reports a negative x-Jalani Cullifer.  He denies fever, chills, dyspnea.  There is some swelling of the foot.   Past Medical History  Diagnosis Date  . GERD (gastroesophageal reflux disease)     resolved  . Heartburn     resolved  . QIONGEXB(284.1)    Past Surgical History  Procedure Laterality Date  . No past surgeries    . Cholecystectomy  04/12/2012    Procedure: LAPAROSCOPIC CHOLECYSTECTOMY WITH INTRAOPERATIVE CHOLANGIOGRAM;  Surgeon: Valarie Merino, MD;  Location: [facility] OR;  Service: General;  Laterality: N/A;   Family History  Problem Relation Age of Onset  . Tuberculosis Paternal Grandfather    History  Substance Use Topics  . Smoking status: Current Every Day Smoker -- 0.50 packs/day for 7 years    Types: Cigarettes  . Smokeless tobacco: Never Used     Comment: also uses vapor/e-cig occasionally   . Alcohol Use: No     Comment: occassionally, socially    Review of Systems  All other systems reviewed and are negative.    Allergies  Review of patient's allergies indicates no known allergies.  Home Medications   Current Outpatient Rx  Name  Route  Sig  Dispense  Refill  . MULTIPLE VITAMIN PO   Oral   Take 1 tablet by mouth daily.         . naproxen sodium (ALEVE) 220 MG tablet   Oral   Take 440 mg by mouth daily as needed. For pain          BP 124/76   Pulse 72  Temp(Src) 98.2 F (36.8 C) (Oral)  Resp 18  Ht 5\' 10"  (1.778 m)  Wt 270 lb (122.471 kg)  BMI 38.74 kg/m2  SpO2 98% Physical Exam  Nursing note and vitals reviewed. Constitutional: He appears well-developed and well-nourished.  HENT:  Head: Normocephalic and atraumatic.  Right Ear: External ear normal.  Left Ear: External ear normal.  Nose: Nose normal.  Mouth/Throat: Oropharynx is clear and moist.  Eyes: Pupils are equal, round, and reactive to light.  Cardiovascular: Normal rate.   Pulmonary/Chest: Effort normal.  Abdominal: Soft.  Musculoskeletal: Normal range of motion. He exhibits edema. He exhibits no tenderness.  Skin: Rash noted.     Skin erythematous, thickened confluently with some vesicules with clear fluid.     ED Course  Procedures (including critical care time) Labs Reviewed - No data to display No results found. No diagnosis found.  MDM  Patient's skin exam c.w. Dermatitis from sumac, but with confluent erythema will also treat with keflex.  Patient advised recheck 2 days and sooner if any worsening specifically spreading rash, pain, or fever.   Hilario Quarry, MD 06/09/13 [phone]

## 2013-06-09 NOTE — ED Notes (Signed)
Pt had leg injury July 4th, now red, with blisters weeping, swelling

## 2013-06-09 NOTE — Discharge Instructions (Signed)
Poison Ivy Poison ivy is a inflammation of the skin (contact dermatitis) caused by touching the allergens on the leaves of the ivy plant following previous exposure to the plant. The rash usually appears 48 hours after exposure. The rash is usually bumps (papules) or blisters (vesicles) in a linear pattern. Depending on your own sensitivity, the rash may simply cause redness and itching, or it may also progress to blisters which may break open. These must be well cared for to prevent secondary bacterial (germ) infection, followed by scarring. Keep any open areas dry, clean, dressed, and covered with an antibacterial ointment if needed. The eyes may also get puffy. The puffiness is worst in the morning and gets better as the day progresses. This dermatitis usually heals without scarring, within 2 to 3 weeks without treatment. HOME CARE INSTRUCTIONS  Thoroughly wash with soap and water as soon as you have been exposed to poison ivy. You have about one half hour to remove the plant resin before it will cause the rash. This washing will destroy the oil or antigen on the skin that is causing, or will cause, the rash. Be sure to wash under your fingernails as any plant resin there will continue to spread the rash. Do not rub skin vigorously when washing affected area. Poison ivy cannot spread if no oil from the plant remains on your body. A rash that has progressed to weeping sores will not spread the rash unless you have not washed thoroughly. It is also important to wash any clothes you have been wearing as these may carry active allergens. The rash will return if you wear the unwashed clothing, even several days later. Avoidance of the plant in the future is the best measure. Poison ivy plant can be recognized by the number of leaves. Generally, poison ivy has three leaves with flowering branches on a single stem. Diphenhydramine may be purchased over the counter and used as needed for itching. Do not drive with  this medication if it makes you drowsy.Ask your caregiver about medication for children. SEEK MEDICAL CARE IF:  Open sores develop.  Redness spreads beyond area of rash.  You notice purulent (pus-like) discharge.  You have increased pain.  Other signs of infection develop (such as fever). Document Released: 11/04/2000 Document Revised: 01/30/2012 Document Reviewed: 09/23/2009 Exit[employer] Patient Information 2014 [employer].  

## 2013-06-12 ENCOUNTER — Ambulatory Visit (INDEPENDENT_AMBULATORY_CARE_PROVIDER_SITE_OTHER): Payer: BC Managed Care – PPO | Admitting: Family Medicine

## 2013-06-12 VITALS — BP 140/88 | HR 72 | Temp 97.7°F | Resp 16 | Ht 70.0 in | Wt 272.0 lb

## 2013-06-12 DIAGNOSIS — S8991XS Unspecified injury of right lower leg, sequela: Secondary | ICD-10-CM

## 2013-06-12 DIAGNOSIS — L259 Unspecified contact dermatitis, unspecified cause: Secondary | ICD-10-CM

## 2013-06-12 DIAGNOSIS — IMO0001 Reserved for inherently not codable concepts without codable children: Secondary | ICD-10-CM

## 2013-06-12 LAB — POCT CBC
Granulocyte percent: 50.8 %G (ref 37–80)
HCT, POC: 48.6 % (ref 43.5–53.7)
Hemoglobin: 15.3 g/dL (ref 14.1–18.1)
POC Granulocyte: 3.8 (ref 2–6.9)
RBC: 5.25 M/uL (ref 4.69–6.13)
RDW, POC: 13.5 %

## 2013-06-12 MED ORDER — PREDNISONE 20 MG PO TABS: ORAL_TABLET | ORAL | Status: DC

## 2013-06-12 NOTE — Patient Instructions (Addendum)
Let's check you back tomorrow- Dr. Cleta Alberts will be here in the evening.  However, it things seem to be getting worse come back in the morning!  Call me at any time if you have a problem.  Continue to take the keflex antibiotic as directed.  We are going to change your prednisone dose slightly- stop taking the 10mg  tablets you received at the ER.  Instead take the 20mg  tablets that I prescribed for you today.   Keep the area cool and dry.  Avoid applying anything to the area if at all possible.  Pat dry gently after bathing.

## 2013-06-12 NOTE — Progress Notes (Addendum)
[facility] [address] [place] [address] [phone]  Date:  06/12/2013   Name:  Steve Cooke   DOB:  July 21, 1981   MRN:  [id number]  PCP:  Default, Provider, MD    Chief Complaint: Follow-up   History of Present Illness:  Steve Cooke is a 32 y.o. very pleasant male patient who presents with the following:  Here today to recheck right leg pain.  He was seen here on 7/8 for an injury.  On July 4th he went to watch fireworks.  He was sitting on the ground near a display of gravestones- one of the stones fell onto his right leg.  It was very heavy- 4 people were needed to remove the stone.  It was on his leg for about 2 minutes.  He had an x-ray here at clinic which did not show any fracture, and he was treated for a contusion with NSAIDs, heat and ice.    He went to the ER on the 20th due to a rash on his leg.  The rash came several days after the original injury.  He was dx with likely rhus dermatitis, and was put on prednisone 20 mg/d and keflex for possible superficial infection.    At this point his pain is better, but he is concerned because the skin continues to blister up.  The original rash has spread over his anterior and lateral calf.  The area is not at all painful- it is just itchy.  He feels well and is not ill. His foot does not hurt and is not numb  Patient Active Problem List   Diagnosis Date Noted  . Acute and subacute cholecystitis with obstructed cystic duct 04/12/2012    Past Medical History  Diagnosis Date  . GERD (gastroesophageal reflux disease)     resolved  . Heartburn     resolved  . MVHQIONG(295.2)     Past Surgical History  Procedure Laterality Date  . No past surgeries    . Cholecystectomy  04/12/2012    Procedure: LAPAROSCOPIC CHOLECYSTECTOMY WITH INTRAOPERATIVE CHOLANGIOGRAM;  Surgeon: Valarie Merino, MD;  Location: [facility] OR;  Service: General;  Laterality: N/A;    History  Substance Use Topics  . Smoking status:  Current Every Day Smoker -- 0.50 packs/day for 7 years    Types: Cigarettes  . Smokeless tobacco: Never Used     Comment: also uses vapor/e-cig occasionally   . Alcohol Use: No     Comment: occassionally, socially    Family History  Problem Relation Age of Onset  . Tuberculosis Paternal Grandfather     No Known Allergies  Medication list has been reviewed and updated.  Current Outpatient Prescriptions on File Prior to Visit  Medication Sig Dispense Refill  . cephALEXin (KEFLEX) 500 MG capsule Take 1 capsule (500 mg total) by mouth 4 (four) times daily.  20 capsule  0  . MULTIPLE VITAMIN PO Take 1 tablet by mouth daily.      . naproxen sodium (ALEVE) 220 MG tablet Take 440 mg by mouth daily as needed. For pain      . predniSONE (DELTASONE) 10 MG tablet Take 2 tablets (20 mg total) by mouth daily.  15 tablet  0   No current facility-administered medications on file prior to visit.    Review of Systems:  As per HPI- otherwise negative. generally healthy except for overweight  Physical Examination: Filed Vitals:   06/12/13 0807  BP:  140/88  Pulse: 72  Temp: 97.7 F (36.5 C)  Resp: 16   Filed Vitals:   06/12/13 0807  Height: 5\' 10"  (1.778 m)  Weight: 272 lb (123.378 kg)   Body mass index is 39.03 kg/(m^2). Ideal Body Weight: Weight in (lb) to have BMI = 25: 173.9  GEN: WDWN, NAD, Non-toxic, A & O x 3 HEENT: Atraumatic, Normocephalic. Neck supple. No masses, No LAD. Ears and Nose: No external deformity. CV: RRR, No M/G/R. No JVD. No thrill. No extra heart sounds. PULM: CTA B, no wheezes, crackles, rhonchi. No retractions. No resp. distress. No accessory muscle use. ABD: S, NT, ND, +BS. No rebound. No HSM. EXTR: No c/c/e NEURO Normal gait.  PSYCH: Normally interactive. Conversant. Not depressed or anxious appearing.  Calm demeanor.  Right lower leg displays a very well demarcated area of redness, edema and some blisters that appears consistent with contact  dermatitis.  The patch extends over the anterior and lateral skin. There is a small patch on the right medial ankle, less severe.  No other rashes. No pain with active or passive motion of toes, of with ankle movement.  Foot with normal perfusion and pulses.  The calf is slightly swollen due to skin swelling, but there are no calf cords or tenderness.   Results for orders placed in visit on 06/12/13  POCT CBC      Result Value Range   WBC 7.4  4.6 - 10.2 K/uL   Lymph, poc 3.1  0.6 - 3.4   POC LYMPH PERCENT 41.4  10 - 50 %L   MID (cbc) 0.6  0 - 0.9   POC MID % 7.8  0 - 12 %M   POC Granulocyte 3.8  2 - 6.9   Granulocyte percent 50.8  37 - 80 %G   RBC 5.25  4.69 - 6.13 M/uL   Hemoglobin 15.3  14.1 - 18.1 g/dL   HCT, POC 57.8  46.9 - 53.7 %   MCV 92.6  80 - 97 fL   MCH, POC 29.1  27 - 31.2 pg   MCHC 31.5 (*) 31.8 - 35.4 g/dL   RDW, POC 62.9     Platelet Count, POC 316  142 - 424 K/uL   MPV 8.2  0 - 99.8 fL     Assessment and Plan: Injury of right leg, sequela - Plan: POCT CBC, Wound culture  Contact dermatitis - Plan: predniSONE (DELTASONE) 20 MG tablet  Likely contact dermatitis.  Unsure etiology- ?bandage materials applied to leg.   Will increase prednisone dose, plan follow- up tomorrow for a recheck.  He will go to the ED if any severe sx or changes.    Signed Abbe Amsterdam, MD  7/25- called and LMOM.  Wound culture negative.  If not continuing to get better please let us know

## 2013-06-13 ENCOUNTER — Ambulatory Visit (INDEPENDENT_AMBULATORY_CARE_PROVIDER_SITE_OTHER): Payer: BC Managed Care – PPO | Admitting: Emergency Medicine

## 2013-06-13 VITALS — BP 118/70 | HR 88 | Temp 98.0°F | Resp 16 | Ht 70.0 in | Wt 272.0 lb

## 2013-06-13 DIAGNOSIS — L03115 Cellulitis of right lower limb: Secondary | ICD-10-CM

## 2013-06-13 DIAGNOSIS — L03119 Cellulitis of unspecified part of limb: Secondary | ICD-10-CM

## 2013-06-13 DIAGNOSIS — L259 Unspecified contact dermatitis, unspecified cause: Secondary | ICD-10-CM

## 2013-06-13 NOTE — Progress Notes (Signed)
  Subjective:    Patient ID: Steve Cooke, male    DOB: 01/06/1981, 32 y.o.   MRN: [id number]  HPI  Pt here for a recheck of his leg. Pt feels like it is doing better today. Cleaning wound with soap and water. Leaving it open to the air except last night he wrapped it so that it wouldn't weep on the bed.   Review of Systems     Objective:   Physical Examthere is a large approximately 8" x 12" area of redness in a square pattern over the right shin. There is blister formation with induration of the soft tissue in this area. There is redness this area but it is less than yesterday.        Assessment & Plan:  Patient improving on current medications. He is to keep the area clean with soap and water. He is to continue prednisone and cephalexin. He is to keep the area open. He is to only have white sheets

## 2013-06-14 LAB — WOUND CULTURE
Gram Stain: NONE SEEN
Gram Stain: NONE SEEN
Organism ID, Bacteria: NO GROWTH

## 2013-06-17 ENCOUNTER — Ambulatory Visit (INDEPENDENT_AMBULATORY_CARE_PROVIDER_SITE_OTHER): Payer: BC Managed Care – PPO | Admitting: Family Medicine

## 2013-06-17 VITALS — BP 120/80 | HR 75 | Temp 97.0°F | Resp 16 | Ht 70.0 in | Wt 272.0 lb

## 2013-06-17 DIAGNOSIS — L259 Unspecified contact dermatitis, unspecified cause: Secondary | ICD-10-CM

## 2013-06-17 MED ORDER — CEPHALEXIN 500 MG PO CAPS: 500.0000 mg | ORAL_CAPSULE | Freq: Three times a day (TID) | ORAL | Status: DC

## 2013-06-17 NOTE — Progress Notes (Signed)
[facility] [address] [place] [address] [phone]  Date:  06/17/2013   Name:  Steve Cooke   DOB:  Jul 02, 1981   MRN:  [id number]  PCP:  Default, Provider, MD    Chief Complaint: Follow-up   History of Present Illness:  Steve Cooke is a 32 y.o. very pleasant male patient who presents with the following:  Here today to recheck dermatitis of his right leg- he originally injured his right leg on 05/24/13 when a display gravestone fell on his leg.  Seen last week with severe apparent contact dermatitis to the leg.  He has completed a course of prednisone and keflex.  Overall the leg seems a lot better- the itching and redness are better.  She has no pain, no fever, no pain or numbness of the leg  Patient Active Problem List   Diagnosis Date Noted  . Acute and subacute cholecystitis with obstructed cystic duct 04/12/2012    Past Medical History  Diagnosis Date  . GERD (gastroesophageal reflux disease)     resolved  . Heartburn     resolved  . GNFAOZHY(865.7)     Past Surgical History  Procedure Laterality Date  . No past surgeries    . Cholecystectomy  04/12/2012    Procedure: LAPAROSCOPIC CHOLECYSTECTOMY WITH INTRAOPERATIVE CHOLANGIOGRAM;  Surgeon: Valarie Merino, MD;  Location: [facility] OR;  Service: General;  Laterality: N/A;    History  Substance Use Topics  . Smoking status: Current Every Day Smoker -- 0.50 packs/day for 7 years    Types: Cigarettes  . Smokeless tobacco: Never Used     Comment: also uses vapor/e-cig occasionally   . Alcohol Use: No     Comment: occassionally, socially    Family History  Problem Relation Age of Onset  . Tuberculosis Paternal Grandfather     No Known Allergies  Medication list has been reviewed and updated.  Current Outpatient Prescriptions on File Prior to Visit  Medication Sig Dispense Refill  . cephALEXin (KEFLEX) 500 MG capsule Take 1 capsule (500 mg total) by mouth 4 (four) times daily.  20  capsule  0  . MULTIPLE VITAMIN PO Take 1 tablet by mouth daily.      . naproxen sodium (ALEVE) 220 MG tablet Take 440 mg by mouth daily as needed. For pain      . predniSONE (DELTASONE) 20 MG tablet Take 2 pills a day for 4 days, then 1 pill a day for 4 days  12 tablet  0   No current facility-administered medications on file prior to visit.    Review of Systems:  As per HPI- otherwise negative.   Physical Examination: Filed Vitals:   06/17/13 0821  BP: 120/80  Pulse: 75  Temp: 97 F (36.1 C)  Resp: 16   Filed Vitals:   06/17/13 0821  Height: 5\' 10"  (1.778 m)  Weight: 272 lb (123.378 kg)   Body mass index is 39.03 kg/(m^2). Ideal Body Weight: Weight in (lb) to have BMI = 25: 173.9  GEN: WDWN, NAD, Non-toxic, A & O x 3 HEENT: Atraumatic, Normocephalic. Neck supple. No masses, No LAD. Ears and Nose: No external deformity. CV: RRR, No M/G/R. No JVD. No thrill. No extra heart sounds. PULM: CTA B, no wheezes, crackles, rhonchi. No retractions. No resp. distress. No accessory muscle use. ABD: S, NT, ND, +BS. No rebound. No HSM. EXTR: No c/c/e NEURO Normal gait.  PSYCH: Normally interactive. Conversant. Not depressed or  anxious appearing.  Calm demeanor.  Right lower leg: looks better.  He still has a well- demarcated rectangular area of contact dermatitis with surface edema.  Blisters have ruptured.  Overall his redness and inflammation is better, and the rash is receeding from its borders.  Foot continues to have normal cap refill, normal sensation, no tenderness and normal ROM of knee, ankle and foot joints.    Assessment and Plan: Contact dermatitis - Plan: cephALEXin (KEFLEX) 500 MG capsule  Refilled keflex to hold- if he notes any sign of infection such as increasing redness please start the medication and call/ RTC.  Otherwise the rash looks much better, expect it will continue to improve with conservative management.    Meds ordered this encounter  Medications  .  cephALEXin (KEFLEX) 500 MG capsule    Sig: Take 1 capsule (500 mg total) by mouth 3 (three) times daily.    Dispense:  21 capsule    Refill:  0     Signed Abbe Amsterdam, MD

## 2013-06-17 NOTE — Patient Instructions (Addendum)
Keep a close eye on your leg.  As long as it continues to improve you do not need to follow- up.  If any concerns please call or come back in.  I gave you another week of the keflex to use if needed- if the area does not seem to be continuing to improve you can fill and take it.

## 2013-11-26 ENCOUNTER — Ambulatory Visit (INDEPENDENT_AMBULATORY_CARE_PROVIDER_SITE_OTHER): Payer: BC Managed Care – PPO | Admitting: Internal Medicine

## 2013-11-26 ENCOUNTER — Encounter: Payer: Self-pay | Admitting: *Deleted

## 2013-11-26 ENCOUNTER — Other Ambulatory Visit (INDEPENDENT_AMBULATORY_CARE_PROVIDER_SITE_OTHER): Payer: BC Managed Care – PPO

## 2013-11-26 ENCOUNTER — Encounter: Payer: Self-pay | Admitting: Internal Medicine

## 2013-11-26 VITALS — BP 120/92 | HR 80 | Temp 99.3°F | Wt 274.0 lb

## 2013-11-26 DIAGNOSIS — Z0000 Encounter for general adult medical examination without abnormal findings: Secondary | ICD-10-CM

## 2013-11-26 DIAGNOSIS — E663 Overweight: Secondary | ICD-10-CM

## 2013-11-26 DIAGNOSIS — Z23 Encounter for immunization: Secondary | ICD-10-CM

## 2013-11-26 LAB — URINALYSIS, ROUTINE W REFLEX MICROSCOPIC
Bilirubin Urine: NEGATIVE
Hgb urine dipstick: NEGATIVE
KETONES UR: NEGATIVE
Leukocytes, UA: NEGATIVE
Nitrite: NEGATIVE
PH: 6 (ref 5.0–8.0)
SPECIFIC GRAVITY, URINE: 1.02 (ref 1.000–1.030)
TOTAL PROTEIN, URINE-UPE24: NEGATIVE
URINE GLUCOSE: NEGATIVE
UROBILINOGEN UA: 0.2 (ref 0.0–1.0)
WBC UA: NONE SEEN — AB (ref 0–?)

## 2013-11-26 LAB — TSH: TSH: 1.06 u[IU]/mL (ref 0.35–5.50)

## 2013-11-26 LAB — CBC WITH DIFFERENTIAL/PLATELET
BASOS PCT: 0.3 % (ref 0.0–3.0)
Basophils Absolute: 0 10*3/uL (ref 0.0–0.1)
EOS PCT: 3.2 % (ref 0.0–5.0)
Eosinophils Absolute: 0.3 10*3/uL (ref 0.0–0.7)
HCT: 45.2 % (ref 39.0–52.0)
HEMOGLOBIN: 15 g/dL (ref 13.0–17.0)
LYMPHS PCT: 26.5 % (ref 12.0–46.0)
Lymphs Abs: 2.6 10*3/uL (ref 0.7–4.0)
MCHC: 33.2 g/dL (ref 30.0–36.0)
MCV: 86.8 fl (ref 78.0–100.0)
MONOS PCT: 9.4 % (ref 3.0–12.0)
Monocytes Absolute: 0.9 10*3/uL (ref 0.1–1.0)
NEUTROS ABS: 5.9 10*3/uL (ref 1.4–7.7)
NEUTROS PCT: 60.6 % (ref 43.0–77.0)
Platelets: 246 10*3/uL (ref 150.0–400.0)
RBC: 5.21 Mil/uL (ref 4.22–5.81)
RDW: 13.7 % (ref 11.5–14.6)
WBC: 9.7 10*3/uL (ref 4.5–10.5)

## 2013-11-26 LAB — HEPATIC FUNCTION PANEL
ALBUMIN: 4.1 g/dL (ref 3.5–5.2)
ALK PHOS: 44 U/L (ref 39–117)
ALT: 31 U/L (ref 0–53)
AST: 20 U/L (ref 0–37)
Bilirubin, Direct: 0.1 mg/dL (ref 0.0–0.3)
TOTAL PROTEIN: 7 g/dL (ref 6.0–8.3)
Total Bilirubin: 0.8 mg/dL (ref 0.3–1.2)

## 2013-11-26 LAB — LIPID PANEL
CHOL/HDL RATIO: 4
CHOLESTEROL: 184 mg/dL (ref 0–200)
HDL: 41.6 mg/dL (ref >39.00)
LDL CALC: 113 mg/dL — AB (ref 0–99)
TRIGLYCERIDES: 146 mg/dL (ref 0.0–149.0)
VLDL: 29.2 mg/dL (ref 0.0–40.0)

## 2013-11-26 LAB — BASIC METABOLIC PANEL
BUN: 11 mg/dL (ref 6–23)
CALCIUM: 9 mg/dL (ref 8.4–10.5)
CO2: 27 meq/L (ref 19–32)
CREATININE: 0.9 mg/dL (ref 0.4–1.5)
Chloride: 105 mEq/L (ref 96–112)
GFR: 103.27 mL/min (ref >60.00)
Glucose, Bld: 97 mg/dL (ref 70–99)
Potassium: 4.1 mEq/L (ref 3.5–5.1)
SODIUM: 139 meq/L (ref 135–145)

## 2013-11-26 NOTE — Progress Notes (Signed)
Subjective:    Patient ID: Steve Cooke, male    DOB: 03-12-1981, 33 y.o.   MRN: [id number]  Cough Pertinent negatives include no chest pain, fever (LGF x 1 wk with URI, improved), headaches, shortness of breath or wheezing.    New pt to me, here to establish care patient is here today for annual physical. Patient feels well overall - recovering from URI last week, improved with OTC cold meds  Past Medical History  Diagnosis Date  . GERD (gastroesophageal reflux disease)     resolved  . Heartburn     resolved  . Headache(784.0)    Family History  Problem Relation Age of Onset  . Tuberculosis Paternal Grandfather    History  Substance Use Topics  . Smoking status: Current Every Day Smoker -- 0.50 packs/day for 7 years    Types: Cigarettes  . Smokeless tobacco: Never Used     Comment: also uses vapor/e-cig occasionally   . Alcohol Use: No     Comment: occassionally, socially    Review of Systems  Constitutional: Negative for fever (LGF x 1 wk with URI, improved), activity change, appetite change, fatigue and unexpected weight change.  Respiratory: Positive for cough (dry x 1 week, improving). Negative for chest tightness, shortness of breath and wheezing.   Cardiovascular: Negative for chest pain, palpitations and leg swelling.  Neurological: Negative for dizziness, weakness and headaches.  Psychiatric/Behavioral: Negative for dysphoric mood. The patient is not nervous/anxious.   All other systems reviewed and are negative.       Objective:   Physical Exam BP 120/92  Pulse 80  Temp(Src) 99.3 F (37.4 C) (Oral)  Wt 274 lb (124.286 kg)  SpO2 96% Wt Readings from Last 3 Encounters:  11/26/13 274 lb (124.286 kg)  06/17/13 272 lb (123.378 kg)  06/13/13 272 lb (123.378 kg)   Constitutional: he is overweight, but appears well-developed and well-nourished. No distress.  HENT: Head: Normocephalic and atraumatic. Ears: B TMs ok, no erythema or effusion; Nose: clear  rhinnorhea and mild turbinate swelling. Mouth/Throat: Oropharynx is min red, but clear and moist. No oropharyngeal exudate.  Eyes: Conjunctivae and EOM are normal. Pupils are equal, round, and reactive to light. No scleral icterus.  Neck: Normal range of motion. Neck supple. No JVD present. No thyromegaly present.  Cardiovascular: Normal rate, regular rhythm and normal heart sounds.  No murmur heard. No BLE edema. Pulmonary/Chest: Effort normal and breath sounds normal. No respiratory distress. he has no wheezes.  Abdominal: Soft. Bowel sounds are normal. he exhibits no distension. There is no tenderness. no masses Musculoskeletal: Normal range of motion, no joint effusions. No gross deformities Neurological: he is alert and oriented to person, place, and time. No cranial nerve deficit. Coordination, balance, strength, speech and gait are normal.  Skin: Skin is warm and dry. No rash noted. No erythema.  Psychiatric: he has a normal mood and affect. behavior is normal. Judgment and thought content normal.  Lab Results  Component Value Date   WBC 7.4 06/12/2013   HGB 15.3 06/12/2013   HCT 48.6 06/12/2013   PLT 209 04/13/2012   GLUCOSE 109* 04/13/2012   ALT 43 04/13/2012   AST 29 04/13/2012   NA 140 04/13/2012   K 4.0 04/13/2012   CL 102 04/13/2012   CREATININE 0.92 04/13/2012   BUN 8 04/13/2012   CO2 31 04/13/2012       Assessment & Plan:   CPX/v70.0- Patient has been counseled on age-appropriate routine health  concerns for screening and prevention. These are reviewed and up-to-date. Immunizations are up-to-date or declined. Labs ordered and reviewed.  URI with cough - advised him on lack of efficacy for traditional antibiotics and viral disease. Continue over-the-counter medications and for Medicare. Symptoms improving, the patient agrees to call if worse or relapse

## 2013-11-26 NOTE — Patient Instructions (Addendum)
It was good to see you today.  We have reviewed your prior records including labs and tests today  Health Maintenance reviewed - Tdap (tetanus, diphtheria and pertussis) updated today -all recommended immunizations and age-appropriate screenings are up-to-date.  Test(s) ordered today. Your results will be released to MyChart (or called to you) after review, usually within 72hours after test completion. If any changes need to be made, you will be notified at that same time.  Medications reviewed and updated, no changes recommended at this time.  Work on lifestyle changes as discussed (low fat, low carb, increased protein diet; improved exercise efforts; weight loss) to control sugar, blood pressure and cholesterol levels and/or reduce risk of developing other medical problems. Look into LimitLaws.com.cymyfitnesspal.com or other type of food journal to assist you in this process.  Congratulations on giving up cigarettes. Keep up the good work and let us know if prescription assistance as needed  Please schedule followup in 12 months for annual exam and labs, call sooner if problems.  Health Maintenance, Males A healthy lifestyle and preventative care can promote health and wellness.  Maintain regular health, dental, and eye exams.  Eat a healthy diet. Foods like vegetables, fruits, whole grains, low-fat dairy products, and lean protein foods contain the nutrients you need without too many calories. Decrease your intake of foods high in solid fats, added sugars, and salt. Get information about a proper diet from your caregiver, if necessary.  Regular physical exercise is one of the most important things you can do for your health. Most adults should get at least 150 minutes of moderate-intensity exercise (any activity that increases your heart rate and causes you to sweat) each week. In addition, most adults need muscle-strengthening exercises on 2 or more days a week.   Maintain a healthy weight. The body  mass index (BMI) is a screening tool to identify possible weight problems. It provides an estimate of body fat based on height and weight. Your caregiver can help determine your BMI, and can help you achieve or maintain a healthy weight. For adults 20 years and older:  A BMI below 18.5 is considered underweight.  A BMI of 18.5 to 24.9 is normal.  A BMI of 25 to 29.9 is considered overweight.  A BMI of 30 and above is considered obese.  Maintain normal blood lipids and cholesterol by exercising and minimizing your intake of saturated fat. Eat a balanced diet with plenty of fruits and vegetables. Blood tests for lipids and cholesterol should begin at age 31 and be repeated every 5 years. If your lipid or cholesterol levels are high, you are over 50, or you are a high risk for heart disease, you may need your cholesterol levels checked more frequently.Ongoing high lipid and cholesterol levels should be treated with medicines, if diet and exercise are not effective.  If you smoke, find out from your caregiver how to quit. If you do not use tobacco, do not start.  Lung cancer screening is recommended for adults aged 31 80 years who are at high risk for developing lung cancer because of a history of smoking. Yearly low-dose computed tomography (CT) is recommended for people who have at least a 30-pack-year history of smoking and are a current smoker or have quit within the past 15 years. A pack year of smoking is smoking an average of 1 pack of cigarettes a day for 1 year (for example: 1 pack a day for 30 years or 2 packs a day for  15 years). Yearly screening should continue until the smoker has stopped smoking for at least 15 years. Yearly screening should also be stopped for people who develop a health problem that would prevent them from having lung cancer treatment.  If you choose to drink alcohol, do not exceed 2 drinks per day. One drink is considered to be 12 ounces (355 mL) of beer, 5 ounces (148  mL) of wine, or 1.5 ounces (44 mL) of liquor.  Avoid use of street drugs. Do not share needles with anyone. Ask for help if you need support or instructions about stopping the use of drugs.  High blood pressure causes heart disease and increases the risk of stroke. Blood pressure should be checked at least every 1 to 2 years. Ongoing high blood pressure should be treated with medicines if weight loss and exercise are not effective.  If you are 13 to 31 years old, ask your caregiver if you should take aspirin to prevent heart disease.  Diabetes screening involves taking a blood sample to check your fasting blood sugar level. This should be done once every 3 years, after age 59, if you are within normal weight and without risk factors for diabetes. Testing should be considered at a younger age or be carried out more frequently if you are overweight and have at least 1 risk factor for diabetes.  Colorectal cancer can be detected and often prevented. Most routine colorectal cancer screening begins at the age of 25 and continues through age 25. However, your caregiver may recommend screening at an earlier age if you have risk factors for colon cancer. On a yearly basis, your caregiver may provide home test kits to check for hidden blood in the stool. Use of a small camera at the end of a tube, to directly examine the colon (sigmoidoscopy or colonoscopy), can detect the earliest forms of colorectal cancer. Talk to your caregiver about this at age 58, when routine screening begins. Direct examination of the colon should be repeated every 5 to 10 years through age 49, unless early forms of pre-cancerous polyps or small growths are found.  Hepatitis C blood testing is recommended for all people born from [birth year] through 1963 and any individual with known risks for hepatitis C.  Healthy men should no longer receive prostate-specific antigen (PSA) blood tests as part of routine cancer screening. Consult with your  caregiver about prostate cancer screening.  Testicular cancer screening is not recommended for adolescents or adult males who have no symptoms. Screening includes self-exam, caregiver exam, and other screening tests. Consult with your caregiver about any symptoms you have or any concerns you have about testicular cancer.  Practice safe sex. Use condoms and avoid high-risk sexual practices to reduce the spread of sexually transmitted infections (STIs).  Use sunscreen. Apply sunscreen liberally and repeatedly throughout the day. You should seek shade when your shadow is shorter than you. Protect yourself by wearing long sleeves, pants, a wide-brimmed hat, and sunglasses year round, whenever you are outdoors.  Notify your caregiver of new moles or changes in moles, especially if there is a change in shape or color. Also notify your caregiver if a mole is larger than the size of a pencil eraser.  A one-time screening for abdominal aortic aneurysm (AAA) and surgical repair of large AAAs by sound wave imaging (ultrasonography) is recommended for ages 22 to 15 years who are current or former smokers.  Stay current with your immunizations. Document Released: 05/05/2008 Document Revised: 03/04/2013 Document  Reviewed: 04/04/2011 Exit[employer] Patient Information 2014 [place], [place]. You Can Quit Smoking If you are ready to quit smoking or are thinking about it, congratulations! You have chosen to help yourself be healthier and live longer! There are lots of different ways to quit smoking. Nicotine gum, nicotine patches, a nicotine inhaler, or nicotine nasal spray can help with physical craving. Hypnosis, support groups, and medicines help break the habit of smoking. TIPS TO GET OFF AND STAY OFF CIGARETTES  Learn to predict your moods. Do not let a bad situation be your excuse to have a cigarette. Some situations in your life might tempt you to have a cigarette.  Ask friends and co-workers not to smoke around  you.  Make your home smoke-free.  Never have "just one" cigarette. It leads to wanting another and another. Remind yourself of your decision to quit.  On a card, make a list of your reasons for not smoking. Read it at least the same number of times a day as you have a cigarette. Tell yourself everyday, "I do not want to smoke. I choose not to smoke."  Ask someone at home or work to help you with your plan to quit smoking.  Have something planned after you eat or have a cup of coffee. Take a walk or get other exercise to perk you up. This will help to keep you from overeating.  Try a relaxation exercise to calm you down and decrease your stress. Remember, you may be tense and nervous the first two weeks after you quit. This will pass.  Find new activities to keep your hands busy. Play with a pen, coin, or rubber band. Doodle or draw things on paper.  Brush your teeth right after eating. This will help cut down the craving for the taste of tobacco after meals. You can try mouthwash too.  Try gum, breath mints, or diet candy to keep something in your mouth. IF YOU SMOKE AND WANT TO QUIT:  Do not stock up on cigarettes. Never buy a carton. Wait until one pack is finished before you buy another.  Never carry cigarettes with you at work or at home.  Keep cigarettes as far away from you as possible. Leave them with someone else.  Never carry matches or a lighter with you.  Ask yourself, "Do I need this cigarette or is this just a reflex?"  Bet with someone that you can quit. Put cigarette money in a piggy bank every morning. If you smoke, you give up the money. If you do not smoke, by the end of the week, you keep the money.  Keep trying. It takes 21 days to change a habit!  Talk to your doctor about using medicines to help you quit. These include nicotine replacement gum, lozenges, or skin patches. Document Released: 09/03/2009 Document Revised: 01/30/2012 Document Reviewed:  09/03/2009 [facility] Patient Information 2014 [place], [place].

## 2013-11-26 NOTE — Progress Notes (Signed)
Pre-visit discussion using our clinic review tool. No additional management support is needed unless otherwise documented below in the visit note.  

## 2014-06-30 ENCOUNTER — Ambulatory Visit (INDEPENDENT_AMBULATORY_CARE_PROVIDER_SITE_OTHER): Payer: BC Managed Care – PPO | Admitting: Physician Assistant

## 2014-06-30 VITALS — BP 116/72 | HR 97 | Temp 98.7°F | Resp 16 | Ht 70.5 in | Wt 276.8 lb

## 2014-06-30 DIAGNOSIS — Z23 Encounter for immunization: Secondary | ICD-10-CM

## 2014-06-30 NOTE — Progress Notes (Signed)
   Subjective:    Patient ID: Steve Cooke, male    DOB: 01-Aug-1981, 33 y.o.   MRN: [id number]   PCP: Gwendolyn Grant, MD  Chief Complaint  Patient presents with  . Immunizations    Medications, allergies, past medical history, surgical history, family history, social history and problem list reviewed and updated.  HPI  Needs MMR dose for school. Records reviewed.  He has never had meningococcal, Hepatitis A or Hepatitis B vaccines and is interested in receiving them here today.  Review of Systems No problems, concerns.    Objective:   Physical Exam  BP 116/72  Pulse 97  Temp(Src) 98.7 F (37.1 C) (Oral)  Resp 16  Ht 5' 10.5" (1.791 m)  Wt 276 lb 12.8 oz (125.556 kg)  BMI 39.14 kg/m2  SpO2 97%  WDWN male, A&O x 3. Normal respiratory effort. Normal behavior and speech. Mood is cheerful, affect bright. Moves easily about the exam room.      Assessment & Plan:  1. Need for MMR vaccine Series is now complete - MMR vaccine subcutaneous  2. Need for meningococcal vaccination - Meningococcal conjugate vaccine 4-valent IM  3. Need for hepatitis A and B vaccination Return in 1 month for Hep B dose #2, and in 6 months for Hep A#2 and Hep B #3. - Hepatitis A vaccine adult IM - Hepatitis B vaccine adult IM   Fara Chute, PA-C Physician Assistant-Certified [facility]

## 2014-11-17 ENCOUNTER — Ambulatory Visit (INDEPENDENT_AMBULATORY_CARE_PROVIDER_SITE_OTHER): Payer: BC Managed Care – PPO

## 2014-11-17 ENCOUNTER — Ambulatory Visit (INDEPENDENT_AMBULATORY_CARE_PROVIDER_SITE_OTHER): Payer: BC Managed Care – PPO | Admitting: Family Medicine

## 2014-11-17 VITALS — BP 118/72 | HR 94 | Temp 97.9°F | Resp 18 | Ht 71.0 in | Wt 266.0 lb

## 2014-11-17 DIAGNOSIS — J988 Other specified respiratory disorders: Secondary | ICD-10-CM

## 2014-11-17 DIAGNOSIS — J22 Unspecified acute lower respiratory infection: Secondary | ICD-10-CM

## 2014-11-17 DIAGNOSIS — R05 Cough: Secondary | ICD-10-CM

## 2014-11-17 DIAGNOSIS — Z720 Tobacco use: Secondary | ICD-10-CM

## 2014-11-17 DIAGNOSIS — R059 Cough, unspecified: Secondary | ICD-10-CM

## 2014-11-17 DIAGNOSIS — F17200 Nicotine dependence, unspecified, uncomplicated: Secondary | ICD-10-CM

## 2014-11-17 DIAGNOSIS — R062 Wheezing: Secondary | ICD-10-CM

## 2014-11-17 MED ORDER — ALBUTEROL SULFATE (2.5 MG/3ML) 0.083% IN NEBU
2.5000 mg | INHALATION_SOLUTION | Freq: Once | RESPIRATORY_TRACT | Status: AC
  Administered 2014-11-17: 2.5 mg via RESPIRATORY_TRACT

## 2014-11-17 MED ORDER — HYDROCODONE-HOMATROPINE 5-1.5 MG/5ML PO SYRP: 5.0000 mL | ORAL_SOLUTION | Freq: Every evening | ORAL | Status: DC | PRN

## 2014-11-17 MED ORDER — IPRATROPIUM BROMIDE 0.02 % IN SOLN
0.5000 mg | Freq: Once | RESPIRATORY_TRACT | Status: AC
  Administered 2014-11-17: 0.5 mg via RESPIRATORY_TRACT

## 2014-11-17 MED ORDER — LEVOFLOXACIN 500 MG PO TABS: 500.0000 mg | ORAL_TABLET | Freq: Every day | ORAL | Status: DC

## 2014-11-17 MED ORDER — ALBUTEROL SULFATE HFA 108 (90 BASE) MCG/ACT IN AERS
2.0000 | INHALATION_SPRAY | Freq: Four times a day (QID) | RESPIRATORY_TRACT | Status: DC | PRN
Start: 2014-11-17 — End: 2014-12-01

## 2014-11-17 NOTE — Progress Notes (Signed)
Chief Complaint:  Chief Complaint  Patient presents with  . Cough    x2 weeks greenish mucous  . Nasal Congestion    HPI: Steve Cooke is a 33 y.o. male who is here for  2 week hx of sinus congestion, HA s and green mucus  production and ear pain intermittently He has tried otc meds without relief, he is a smoker and cont to smoke He does have sore throat, denies recents fevers but has ahd chills, wheezing and CP and SOB  Brother has asthma  Past Medical History  Diagnosis Date  . GERD (gastroesophageal reflux disease)     resolved  . Heartburn     resolved  . ZOXWRUEA(540.9)    Past Surgical History  Procedure Laterality Date  . Cholecystectomy  04/12/2012    Procedure: LAPAROSCOPIC CHOLECYSTECTOMY WITH INTRAOPERATIVE CHOLANGIOGRAM;  Surgeon: Valarie Merino, MD;  Location: [facility];  Service: General;  Laterality: N/A;   History   Social History  . Marital Status: Married    Spouse Name: Gardiner Ramus    Number of Children: 0  . Years of Education: N/A   Occupational History  . 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 Customer Service Rep     [employer]   Social History Main Topics  . Smoking status: Current Every Day Smoker -- 0.50 packs/day for 7 years    Types: Cigarettes  . Smokeless tobacco: Never Used     Comment: also uses vapor/e-cig occasionally   . Alcohol Use: No     Comment: occassionally, socially  . Drug Use: No  . Sexual Activity:    Partners: Female    Pharmacist, hospital Protection: Condom   Other Topics Concern  . None   Social History Narrative   Lives with wife, Gardiner Ramus.   [employer] student in [employer].   Family History  Problem Relation Age of Onset  . Tuberculosis Paternal Grandfather   . Diabetes Mellitus II Mother 24  . Lung cancer Paternal Uncle 77    nonsmoker   No Known Allergies Prior to Admission medications   Not on File     ROS: The patient denies fevers, chills, night sweats,  unintentional weight loss, chest pain, palpitations, wheezing, dyspnea on exertion, nausea, vomiting, abdominal pain, dysuria, hematuria, melena, + numbness, , or tingling.   All other systems have been reviewed and were otherwise negative with the exception of those mentioned in the HPI and as above.    PHYSICAL EXAM: Filed Vitals:   11/17/14 0824  BP: 118/72  Pulse: 94  Temp: 97.9 F (36.6 C)  Resp: 18   Filed Vitals:   11/17/14 0824  Height: 5\' 11"  (1.803 m)  Weight: 266 lb (120.657 kg)   Body mass index is 37.12 kg/(m^2).  General: Alert, no acute distress HEENT:  Normocephalic, atraumatic, oropharynx patent. EOMI, PERRLA Erythematous throat, no exudates, TM normal, + sinus tenderness, + erythematous/boggy nasal mucosa Cardiovascular:  Regular rate and rhythm, no rubs murmurs or gallops.  No Carotid bruits, radial pulse intact. No pedal edema.  Respiratory: + wheezes,  NO rales, or rhonchi.  No cyanosis, no use of accessory musculature GI: No organomegaly, abdomen is soft and non-tender, positive bowel sounds.  No masses. Skin: No rashes. Neurologic: Facial musculature symmetric. Psychiatric: Patient is appropriate throughout our interaction. Lymphatic: No cervical lymphadenopathy Musculoskeletal: Gait intact.   LABS: Results for orders placed or performed in visit  on 11/26/13  TSH  Result Value Ref Range   TSH 1.06 0.35 - 5.50 uIU/mL  Lipid panel  Result Value Ref Range   Cholesterol 184 0 - 200 mg/dL   Triglycerides 161.0146.0 0.0 - 149.0 mg/dL   HDL 96.0441.60 >54.09>39.00 mg/dL   VLDL 81.129.2 0.0 - 91.440.0 mg/dL   LDL Cholesterol 782113 (H) 0 - 99 mg/dL   Total CHOL/HDL Ratio 4   Hepatic function panel  Result Value Ref Range   Total Bilirubin 0.8 0.3 - 1.2 mg/dL   Bilirubin, Direct 0.1 0.0 - 0.3 mg/dL   Alkaline Phosphatase 44 39 - 117 U/L   AST 20 0 - 37 U/L   ALT 31 0 - 53 U/L   Total Protein 7.0 6.0 - 8.3 g/dL   Albumin 4.1 3.5 - 5.2 g/dL  Basic metabolic panel  Result  Value Ref Range   Sodium 139 135 - 145 mEq/L   Potassium 4.1 3.5 - 5.1 mEq/L   Chloride 105 96 - 112 mEq/L   CO2 27 19 - 32 mEq/L   Glucose, Bld 97 70 - 99 mg/dL   BUN 11 6 - 23 mg/dL   Creatinine, Ser 0.9 0.4 - 1.5 mg/dL   Calcium 9.0 8.4 - 95.610.5 mg/dL   GFR 213.08103.27 >65.78>60.00 mL/min  CBC with Differential  Result Value Ref Range   WBC 9.7 4.5 - 10.5 K/uL   RBC 5.21 4.22 - 5.81 Mil/uL   Hemoglobin 15.0 13.0 - 17.0 g/dL   HCT 46.945.2 62.939.0 - 52.852.0 %   MCV 86.8 78.0 - 100.0 fl   MCHC 33.2 30.0 - 36.0 g/dL   RDW 41.313.7 24.411.5 - 01.014.6 %   Platelets 246.0 150.0 - 400.0 K/uL   Neutrophils Relative % 60.6 43.0 - 77.0 %   Lymphocytes Relative 26.5 12.0 - 46.0 %   Monocytes Relative 9.4 3.0 - 12.0 %   Eosinophils Relative 3.2 0.0 - 5.0 %   Basophils Relative 0.3 0.0 - 3.0 %   Neutro Abs 5.9 1.4 - 7.7 K/uL   Lymphs Abs 2.6 0.7 - 4.0 K/uL   Monocytes Absolute 0.9 0.1 - 1.0 K/uL   Eosinophils Absolute 0.3 0.0 - 0.7 K/uL   Basophils Absolute 0.0 0.0 - 0.1 K/uL  Urinalysis, Routine w reflex microscopic  Result Value Ref Range   Color, Urine YELLOW Yellow;Lt. Yellow   APPearance CLEAR Clear   Specific Gravity, Urine 1.020 1.000-1.030   pH 6.0 5.0 - 8.0   Total Protein, Urine NEGATIVE Negative   Urine Glucose NEGATIVE Negative   Ketones, ur NEGATIVE Negative   Bilirubin Urine NEGATIVE Negative   Hgb urine dipstick NEGATIVE Negative   Urobilinogen, UA 0.2 0.0 - 1.0   Leukocytes, UA NEGATIVE Negative   Nitrite NEGATIVE Negative   WBC, UA No formed elements seen on urine microscopic exam. (A) 0-2/hpf     EKG/XRAY:   [facility]. Increase vascular markings vs ? Infiltrate middle right lobe, please comment Borderline cardiomegaly   ASSESSMENT/PLAN: Encounter Diagnoses  Name Primary?  . Cough   . Tobacco use disorder   . Wheezing   . Lower respiratory infection (e.g., bronchitis, pneumonia, pneumonitis, pulmonitis) Yes   33 y/o male with PMH of tobacco abuse  who is here with 2 week hx of LRI Will presumptively treat for PNA based on sxs and chest xray Rx Levaquin, albuterol, and hycodan, otc nasacort and also delsyn  Will return once he is better to consider  chantix   Gross sideeffects, risk and benefits, and alternatives of medications d/w patient. Patient is aware that all medications have potential sideeffects and we are unable to predict every sideeffect or drug-drug interaction that may occur.  Jacky Dross, Radwan PHUONG, DO 11/17/2014 9:40 AM

## 2014-12-01 ENCOUNTER — Ambulatory Visit (INDEPENDENT_AMBULATORY_CARE_PROVIDER_SITE_OTHER): Payer: BLUE CROSS/BLUE SHIELD

## 2014-12-01 ENCOUNTER — Ambulatory Visit (INDEPENDENT_AMBULATORY_CARE_PROVIDER_SITE_OTHER): Payer: BLUE CROSS/BLUE SHIELD | Admitting: Physician Assistant

## 2014-12-01 VITALS — BP 110/82 | HR 88 | Temp 98.6°F | Resp 24 | Ht 70.0 in | Wt 266.2 lb

## 2014-12-01 DIAGNOSIS — R0602 Shortness of breath: Secondary | ICD-10-CM

## 2014-12-01 DIAGNOSIS — J22 Unspecified acute lower respiratory infection: Secondary | ICD-10-CM

## 2014-12-01 DIAGNOSIS — R05 Cough: Secondary | ICD-10-CM

## 2014-12-01 DIAGNOSIS — R0989 Other specified symptoms and signs involving the circulatory and respiratory systems: Secondary | ICD-10-CM

## 2014-12-01 DIAGNOSIS — R062 Wheezing: Secondary | ICD-10-CM

## 2014-12-01 DIAGNOSIS — Z716 Tobacco abuse counseling: Secondary | ICD-10-CM

## 2014-12-01 MED ORDER — DOXYCYCLINE HYCLATE 100 MG PO CAPS: 100.0000 mg | ORAL_CAPSULE | Freq: Two times a day (BID) | ORAL | Status: DC

## 2014-12-01 MED ORDER — IPRATROPIUM BROMIDE 0.02 % IN SOLN
0.5000 mg | Freq: Once | RESPIRATORY_TRACT | Status: AC
  Administered 2014-12-01: 0.5 mg via RESPIRATORY_TRACT

## 2014-12-01 MED ORDER — BENZONATATE 100 MG PO CAPS: 100.0000 mg | ORAL_CAPSULE | Freq: Three times a day (TID) | ORAL | Status: DC | PRN

## 2014-12-01 MED ORDER — HYDROCOD POLST-CHLORPHEN POLST 10-8 MG/5ML PO LQCR: 5.0000 mL | Freq: Two times a day (BID) | ORAL | Status: DC | PRN

## 2014-12-01 MED ORDER — ALBUTEROL SULFATE HFA 108 (90 BASE) MCG/ACT IN AERS: 2.0000 | INHALATION_SPRAY | Freq: Four times a day (QID) | RESPIRATORY_TRACT | Status: DC | PRN

## 2014-12-01 MED ORDER — PREDNISONE 20 MG PO TABS: ORAL_TABLET | ORAL | Status: DC

## 2014-12-01 MED ORDER — ALBUTEROL SULFATE (2.5 MG/3ML) 0.083% IN NEBU
2.5000 mg | INHALATION_SOLUTION | Freq: Once | RESPIRATORY_TRACT | Status: AC
  Administered 2014-12-01: 2.5 mg via RESPIRATORY_TRACT

## 2014-12-01 NOTE — Progress Notes (Signed)
Subjective:    Patient ID: Steve Cooke, male    DOB: 09/18/1981, 34 y.o.   MRN: [id number]  HPI Patient presents for 12 days of productive cough and chest congestion. Sx are accompanied by fatigue, nasal congestion, sore throat, SOB, and CP in axillary line on right side. Denies HA, sinus pressure, or ear pain. Was treated for lower resp infection 10/2014 with levaquin, hycodan, and albuterol inhaler. Sx of initial illness improved minus lingering cough. Currently taking albuterol and has tried DayQuil, NyQuil, Delsym, and Halls without relief. Denies h/o asthma or allergies. Smokes daily, but has cut back since he has been sick this time to 1 cigarette daily.    Review of Systems  Constitutional: Positive for fatigue. Negative for fever, chills, diaphoresis, activity change and appetite change.  HENT: Positive for congestion and sore throat. Negative for ear pain, postnasal drip, rhinorrhea, sinus pressure and sneezing.   Eyes: Negative.   Respiratory: Positive for cough, shortness of breath and wheezing. Negative for chest tightness.   Cardiovascular: Positive for chest pain.  Gastrointestinal: Negative for nausea, vomiting and abdominal pain.  Musculoskeletal: Negative for neck pain and neck stiffness.  Skin: Negative for rash.  Allergic/Immunologic: Negative for environmental allergies and food allergies.  Neurological: Negative for dizziness, light-headedness and headaches.  Hematological: Negative for adenopathy.       Objective:   Physical Exam  Constitutional: He is oriented to person, place, and time. He appears well-developed and well-nourished. No distress.  Blood pressure 110/82, pulse 88, temperature 98.6 F (37 C), temperature source Oral, resp. rate 24, height 5\' 10"  (1.778 m), weight 266 lb 4 oz (120.77 kg), SpO2 92 %.  HENT:  Head: Normocephalic and atraumatic.  Right Ear: Tympanic membrane, external ear and ear canal normal.  Left Ear: Tympanic membrane, external  ear and ear canal normal.  Nose: Rhinorrhea present. No mucosal edema or sinus tenderness. Right sinus exhibits no maxillary sinus tenderness and no frontal sinus tenderness. Left sinus exhibits no maxillary sinus tenderness and no frontal sinus tenderness.  Mouth/Throat: Uvula is midline, oropharynx is clear and moist and mucous membranes are normal. No oropharyngeal exudate.  Eyes: Conjunctivae are normal. Pupils are equal, round, and reactive to light. Right eye exhibits no discharge. Left eye exhibits no discharge. No scleral icterus.  Neck: Normal range of motion. Neck supple. No Brudzinski's sign noted. No thyromegaly present.  Cardiovascular: Normal rate, regular rhythm and normal heart sounds.  Exam reveals no gallop and no friction rub.   No murmur heard. Pulmonary/Chest: Effort normal. No accessory muscle usage. No respiratory distress. He has wheezes in the right upper field, the right middle field, the right lower field, the left upper field, the left middle field and the left lower field. He has no rhonchi. He has rales in the left middle field and the left lower field.  Lymphadenopathy:    He has cervical adenopathy.  Neurological: He is alert and oriented to person, place, and time.  Skin: Skin is warm and dry. No rash noted. He is not diaphoretic. No erythema. No pallor.   [facility] reading (PRIMARY) by  Dr. Mindi JunkerGottlieb. Lower right lobe infiltrate. Unchanged from 11/17/14.  Pulse Ox improved to 95% following neb tx.     Assessment & Plan:  1. Lung crackles 2. Lower respiratory infection Should RTC in 4 weeks for repeat CXR. Sooner if sx do not improve. Smoking cessation discussed.  - DG Chest 2 View; Future - albuterol (PROVENTIL) (2.5 MG/3ML) 0.083% nebulizer  solution 2.5 mg; Take 3 mLs (2.5 mg total) by nebulization once. - ipratropium (ATROVENT) nebulizer solution 0.5 mg; Take 2.5 mLs (0.5 mg total) by nebulization once. - chlorpheniramine-HYDROcodone (TUSSIONEX PENNKINETIC ER)  10-8 MG/5ML LQCR; Take 5 mLs by mouth every 12 (twelve) hours as needed for cough (cough).  Dispense: 60 mL; Refill: 0 - benzonatate (TESSALON) 100 MG capsule; Take 1-2 capsules (100-200 mg total) by mouth 3 (three) times daily as needed for cough.  Dispense: 40 capsule; Refill: 0 - doxycycline (VIBRAMYCIN) 100 MG capsule; Take 1 capsule (100 mg total) by mouth 2 (two) times daily.  Dispense: 20 capsule; Refill: 0 - albuterol (PROVENTIL HFA;VENTOLIN HFA) 108 (90 BASE) MCG/ACT inhaler; Inhale 2 puffs into the lungs every 6 (six) hours as needed for wheezing or shortness of breath.  Dispense: 1 Inhaler; Refill: 0 - predniSONE (DELTASONE) 20 MG tablet; Take 3 PO QAM x3days, 2 PO QAM x3days, 1 PO QAM x3days  Dispense: 18 tablet; Refill: 0   Steve Corter PA-C  [facility] 12/01/2014 9:04 PM

## 2014-12-01 NOTE — Patient Instructions (Signed)
1. Stop Smoking! 2. Return in 4 weeks for repeat CXR. Sooner if sx do not improve.

## 2014-12-03 ENCOUNTER — Telehealth: Payer: Self-pay | Admitting: Physician Assistant

## 2014-12-03 DIAGNOSIS — I517 Cardiomegaly: Secondary | ICD-10-CM

## 2014-12-03 NOTE — Telephone Encounter (Signed)
Left voicemail with patient. Enlarge heart noted by radiologist on CXR. With him being so young, would like to get echo for further evaluation. Referral already put in so they will call you to set an appointment.

## 2014-12-04 NOTE — Telephone Encounter (Signed)
Spoke with pt. Notified of below info. He is agreeable to going for an echo.

## 2014-12-11 ENCOUNTER — Emergency Department (HOSPITAL_COMMUNITY): Payer: BLUE CROSS/BLUE SHIELD

## 2014-12-11 ENCOUNTER — Emergency Department (HOSPITAL_COMMUNITY)
Admission: EM | Admit: 2014-12-11 | Discharge: 2014-12-12 | Disposition: A | Discharge status: Home / Self Care | Payer: BLUE CROSS/BLUE SHIELD | Attending: Emergency Medicine | Admitting: Emergency Medicine

## 2014-12-11 ENCOUNTER — Encounter (HOSPITAL_COMMUNITY): Payer: Self-pay | Admitting: Emergency Medicine

## 2014-12-11 DIAGNOSIS — R059 Cough, unspecified: Secondary | ICD-10-CM

## 2014-12-11 DIAGNOSIS — Z8719 Personal history of other diseases of the digestive system: Secondary | ICD-10-CM | POA: Insufficient documentation

## 2014-12-11 DIAGNOSIS — Z792 Long term (current) use of antibiotics: Secondary | ICD-10-CM | POA: Diagnosis not present

## 2014-12-11 DIAGNOSIS — R05 Cough: Secondary | ICD-10-CM | POA: Diagnosis present

## 2014-12-11 DIAGNOSIS — Z720 Tobacco use: Secondary | ICD-10-CM | POA: Insufficient documentation

## 2014-12-11 DIAGNOSIS — J209 Acute bronchitis, unspecified: Secondary | ICD-10-CM | POA: Diagnosis not present

## 2014-12-11 MED ORDER — ALBUTEROL SULFATE HFA 108 (90 BASE) MCG/ACT IN AERS
2.0000 | INHALATION_SPRAY | RESPIRATORY_TRACT | Status: DC | PRN
  Administered 2014-12-12: 2 via RESPIRATORY_TRACT
  Filled 2014-12-11: qty 6.7

## 2014-12-11 MED ORDER — ALBUTEROL SULFATE (2.5 MG/3ML) 0.083% IN NEBU
5.0000 mg | INHALATION_SOLUTION | Freq: Once | RESPIRATORY_TRACT | Status: AC
  Administered 2014-12-11: 5 mg via RESPIRATORY_TRACT
  Filled 2014-12-11: qty 6

## 2014-12-11 MED ORDER — ALBUTEROL SULFATE (2.5 MG/3ML) 0.083% IN NEBU: 2.5000 mg | INHALATION_SOLUTION | RESPIRATORY_TRACT | Status: AC | PRN

## 2014-12-11 MED ORDER — IPRATROPIUM BROMIDE 0.02 % IN SOLN
0.5000 mg | Freq: Once | RESPIRATORY_TRACT | Status: AC
  Administered 2014-12-11: 0.5 mg via RESPIRATORY_TRACT
  Filled 2014-12-11: qty 2.5

## 2014-12-11 NOTE — ED Provider Notes (Signed)
CSN: [id number]     Arrival date & time 12/11/14  2220 History   First MD Initiated Contact with Patient 12/11/14 2235     Chief Complaint  Patient presents with  . Cough     (Consider location/radiation/quality/duration/timing/severity/associated sxs/prior Treatment) The history is provided by the patient and medical records. No language interpreter was used.     Steve Cooke is a 34 y.o. male  with a hx of GERD presents to the Emergency Department complaining of gradual, persistent, progressively worsening cough, wheezing and shortness of breath onset this evening.  Pt reports he was diagnosed with URI (viral) 2 weeks ago and given albuterol, tussionex, prednisone and tessalon.  He has completed these medications with moderate improvement with the current symptoms returning today.  Pt reports using the albuterol at home with minimal relief.  Pt denies fever, chills, abd pain, N/V/D.  Pt reports associated upper back pain with coughing.  He denies chest pain, abdominal pain, nausea, vomiting, fevers, chills, weakness, syncope, dysuria.  He denies a history of asthma. He reports he finished his prednisone yesterday.   Past Medical History  Diagnosis Date  . GERD (gastroesophageal reflux disease)     resolved  . Heartburn     resolved  . [id number](478.2)    Past Surgical History  Procedure Laterality Date  . Cholecystectomy  04/12/2012    Procedure: LAPAROSCOPIC CHOLECYSTECTOMY WITH INTRAOPERATIVE CHOLANGIOGRAM;  Surgeon: Valarie Merino, MD;  Location: [facility] OR;  Service: General;  Laterality: N/A;   Family History  Problem Relation Age of Onset  . Tuberculosis Paternal Grandfather   . Diabetes Mellitus II Mother 84  . Lung cancer Paternal Uncle 3    nonsmoker   History  Substance Use Topics  . Smoking status: Current Every Day Smoker -- 0.50 packs/day for 7 years    Types: Cigarettes  . Smokeless tobacco: Never Used     Comment: also uses vapor/e-cig occasionally   . Alcohol  Use: No    Review of Systems  Constitutional: Negative for fever, chills, appetite change and fatigue.  HENT: Negative for congestion, ear discharge, ear pain, mouth sores, postnasal drip, rhinorrhea, sinus pressure and sore throat.   Eyes: Negative for visual disturbance.  Respiratory: Positive for cough, chest tightness, shortness of breath and wheezing. Negative for stridor.   Cardiovascular: Negative for chest pain, palpitations and leg swelling.  Gastrointestinal: Negative for nausea, vomiting, abdominal pain and diarrhea.  Genitourinary: Negative for dysuria, urgency, frequency and hematuria.  Musculoskeletal: Negative for myalgias, back pain, arthralgias and neck stiffness.  Skin: Negative for rash.  Neurological: Negative for syncope, light-headedness, numbness and headaches.  Hematological: Negative for adenopathy.  Psychiatric/Behavioral: The patient is not nervous/anxious.   All other systems reviewed and are negative.     Allergies  Review of patient's allergies indicates no known allergies.  Home Medications   Prior to Admission medications   Medication Sig Start Date End Date Taking? Authorizing Provider  albuterol (PROVENTIL HFA;VENTOLIN HFA) 108 (90 BASE) MCG/ACT inhaler Inhale 2 puffs into the lungs every 6 (six) hours as needed for wheezing or shortness of breath. 12/01/14  Yes Tishira R Brewington, PA-C  chlorpheniramine-HYDROcodone (TUSSIONEX PENNKINETIC ER) 10-8 MG/5ML LQCR Take 5 mLs by mouth every 12 (twelve) hours as needed for cough (cough). 12/01/14  Yes Tishira R Brewington, PA-C  albuterol (PROVENTIL) (2.5 MG/3ML) 0.083% nebulizer solution Take 3 mLs (2.5 mg total) by nebulization every 4 (four) hours as needed for wheezing or shortness of breath. 12/11/14  [name], PA-C  benzonatate (TESSALON) 100 MG capsule Take 1-2 capsules (100-200 mg total) by mouth 3 (three) times daily as needed for cough. Patient not taking: Reported on 12/11/2014 12/01/14    Tishira R Brewington, PA-C  doxycycline (VIBRAMYCIN) 100 MG capsule Take 1 capsule (100 mg total) by mouth 2 (two) times daily. Patient not taking: Reported on 12/11/2014 12/01/14   Tishira R Brewington, PA-C  predniSONE (DELTASONE) 20 MG tablet Take 3 PO QAM x3days, 2 PO QAM x3days, 1 PO QAM x3days Patient not taking: Reported on 12/11/2014 12/01/14   Tishira R Brewington, PA-C   BP 125/73 mmHg  Pulse 111  Temp(Src) 98.1 F (36.7 C) (Oral)  Resp 18  SpO2 93% Physical Exam  Constitutional: He is oriented to person, place, and time. He appears well-developed and well-nourished. No distress.  HENT:  Head: Normocephalic and atraumatic.  Right Ear: Tympanic membrane, external ear and ear canal normal.  Left Ear: Tympanic membrane, external ear and ear canal normal.  Nose: Nose normal. No mucosal edema or rhinorrhea. No epistaxis. Right sinus exhibits no maxillary sinus tenderness and no frontal sinus tenderness. Left sinus exhibits no maxillary sinus tenderness and no frontal sinus tenderness.  Mouth/Throat: Uvula is midline, oropharynx is clear and moist and mucous membranes are normal. Mucous membranes are not pale and not cyanotic. No oropharyngeal exudate, posterior oropharyngeal edema, posterior oropharyngeal erythema or tonsillar abscesses.  Eyes: Conjunctivae are normal. Pupils are equal, round, and reactive to light.  Neck: Normal range of motion and full passive range of motion without pain.  Cardiovascular: Normal rate and intact distal pulses.   Pulmonary/Chest: Effort normal. No stridor. He has decreased breath sounds. He has wheezes.  Decreased breath sounds and scattered inspiratory and expiratory wheezes throughout No focal rhonchi or rails  Abdominal: Soft. Bowel sounds are normal. There is no tenderness.  Musculoskeletal: Normal range of motion.  Lymphadenopathy:    He has no cervical adenopathy.  Neurological: He is alert and oriented to person, place, and time.  Skin: Skin  is warm and dry. No rash noted. He is not diaphoretic.  Psychiatric: He has a normal mood and affect.  Nursing note and vitals reviewed.   ED Course  Procedures (including critical care time) Labs Review Labs Reviewed - No data to display  Imaging Review Dg Chest 2 View  12/11/2014   CLINICAL DATA:  Patient states that he has been short of breath for past two days and has had upper back pain, states MD told him he had viral infection  EXAM: CHEST  2 VIEW  COMPARISON:  12/01/2014  FINDINGS: Mild central peribronchial interstitial prominence. No lung consolidation to suggest pneumonia. No pleural effusion or pneumothorax.  Cardiac silhouette is normal in size. Normal mediastinal and hilar contours.  Bony thorax is unremarkable.  IMPRESSION: Mild central peribronchovascular interstitial prominence which is consistent with a viral respiratory infection. No lobar pneumonia. No pulmonary edema. No other abnormality.   Electronically Signed   By: Amie Portland M.D.   On: 12/11/2014 23:37     EKG Interpretation None      MDM   Final diagnoses:  Cough  Bronchospasm with bronchitis, acute   Steve Cooke presents with cough, shortness of breath and wheezing after being diagnosed with viral URI approximately 2 weeks ago.  Will give albuterol nebulization and reassess.   12:01 AM Pt CXR negative for acute infiltrate. Patients symptoms are consistent with URI, likely viral etiology. Discussed that antibiotics are not indicated  for viral infections. Patient ambulated in ED with O2 saturations maintained >90, no current signs of respiratory distress. Lung exam improved after nebulizer treatment. Patient just completed prednisone course. Pt states they are breathing at baseline. Pt has been instructed to continue using prescribed medications and to speak with PCP.  I have personally reviewed patient's vitals, nursing note and any pertinent labs or imaging.  I performed an undressed physical exam.     It has been determined that no acute conditions requiring further emergency intervention are present at this time. The patient/guardian have been advised of the diagnosis and plan. I reviewed all labs and imaging including any potential incidental findings. We have discussed signs and symptoms that warrant return to the ED and they are listed in the discharge instructions.    Vital signs are stable at discharge.   BP 125/73 mmHg  Pulse 111  Temp(Src) 98.1 F (36.7 C) (Oral)  Resp 18  SpO2 93%        Dierdre ForthHannah Ludwig Tugwell, PA-C 12/12/14 0017  Gwyneth SproutWhitney Plunkett, MD 12/12/14 1137

## 2014-12-11 NOTE — ED Notes (Signed)
Pt states he was recently diagnosed with a viral infection in his lungs and is currently taking medications for it  Pt states today his cough is worse, having wheezing, and feels short of breath

## 2014-12-11 NOTE — Discharge Instructions (Signed)
1. Medications: Albuterol nebulization liquid, usual home medications 2. Treatment: rest, drink plenty of fluids, by nebulizer at drugstore, continue using albuterol inhaler as needed3 3. Follow Up: Please followup with your primary doctor in 3 days for discussion of your diagnoses and further evaluation after today's visit; if you do not have a primary care doctor use the resource guide provided to find one; Please return to the ER for worsening shortness of breath, difficulty breathing, high fevers or other concerning symptoms   Acute Bronchitis Bronchitis is inflammation of the airways that extend from the windpipe into the lungs (bronchi). The inflammation often causes mucus to develop. This leads to a cough, which is the most common symptom of bronchitis.  In acute bronchitis, the condition usually develops suddenly and goes away over time, usually in a couple weeks. Smoking, allergies, and asthma can make bronchitis worse. Repeated episodes of bronchitis may cause further lung problems.  CAUSES Acute bronchitis is most often caused by the same virus that causes a cold. The virus can spread from person to person (contagious) through coughing, sneezing, and touching contaminated objects. SIGNS AND SYMPTOMS   Cough.   Fever.   Coughing up mucus.   Body aches.   Chest congestion.   Chills.   Shortness of breath.   Sore throat.  DIAGNOSIS  Acute bronchitis is usually diagnosed through a physical exam. Your health care provider will also ask you questions about your medical history. Tests, such as chest X-rays, are sometimes done to rule out other conditions.  TREATMENT  Acute bronchitis usually goes away in a couple weeks. Oftentimes, no medical treatment is necessary. Medicines are sometimes given for relief of fever or cough. Antibiotic medicines are usually not needed but may be prescribed in certain situations. In some cases, an inhaler may be recommended to help reduce  shortness of breath and control the cough. A cool mist vaporizer may also be used to help thin bronchial secretions and make it easier to clear the chest.  HOME CARE INSTRUCTIONS  Get plenty of rest.   Drink enough fluids to keep your urine clear or pale yellow (unless you have a medical condition that requires fluid restriction). Increasing fluids may help thin your respiratory secretions (sputum) and reduce chest congestion, and it will prevent dehydration.   Take medicines only as directed by your health care provider.  If you were prescribed an antibiotic medicine, finish it all even if you start to feel better.  Avoid smoking and secondhand smoke. Exposure to cigarette smoke or irritating chemicals will make bronchitis worse. If you are a smoker, consider using nicotine gum or skin patches to help control withdrawal symptoms. Quitting smoking will help your lungs heal faster.   Reduce the chances of another bout of acute bronchitis by washing your hands frequently, avoiding people with cold symptoms, and trying not to touch your hands to your mouth, nose, or eyes.   Keep all follow-up visits as directed by your health care provider.  SEEK MEDICAL CARE IF: Your symptoms do not improve after 1 week of treatment.  SEEK IMMEDIATE MEDICAL CARE IF:  You develop an increased fever or chills.   You have chest pain.   You have severe shortness of breath.  You have bloody sputum.   You develop dehydration.  You faint or repeatedly feel like you are going to pass out.  You develop repeated vomiting.  You develop a severe headache. MAKE SURE YOU:   Understand these instructions.  Will watch  your condition.  Will get help right away if you are not doing well or get worse. Document Released: 12/15/2004 Document Revised: 03/24/2014 Document Reviewed: 04/30/2013 [facility] Patient Information 2015 [place], [place]. This information is not intended to replace advice given to you  by your health care provider. Make sure you discuss any questions you have with your health care provider.

## 2014-12-15 ENCOUNTER — Ambulatory Visit (INDEPENDENT_AMBULATORY_CARE_PROVIDER_SITE_OTHER): Payer: BLUE CROSS/BLUE SHIELD | Admitting: Family Medicine

## 2014-12-15 VITALS — BP 116/74 | HR 95 | Temp 98.1°F | Resp 18 | Ht 70.5 in | Wt 267.0 lb

## 2014-12-15 DIAGNOSIS — J22 Unspecified acute lower respiratory infection: Secondary | ICD-10-CM

## 2014-12-15 DIAGNOSIS — R05 Cough: Secondary | ICD-10-CM

## 2014-12-15 DIAGNOSIS — R059 Cough, unspecified: Secondary | ICD-10-CM

## 2014-12-15 DIAGNOSIS — J988 Other specified respiratory disorders: Secondary | ICD-10-CM

## 2014-12-15 MED ORDER — PREDNISONE 20 MG PO TABS: ORAL_TABLET | ORAL | Status: AC

## 2014-12-15 MED ORDER — ALBUTEROL SULFATE HFA 108 (90 BASE) MCG/ACT IN AERS: 2.0000 | INHALATION_SPRAY | Freq: Four times a day (QID) | RESPIRATORY_TRACT | Status: AC | PRN

## 2014-12-15 MED ORDER — HYDROCOD POLST-CHLORPHEN POLST 10-8 MG/5ML PO LQCR: 5.0000 mL | Freq: Two times a day (BID) | ORAL | Status: AC | PRN

## 2014-12-15 NOTE — Patient Instructions (Signed)

## 2014-12-15 NOTE — Progress Notes (Signed)
Patient ID: Steve Cooke MRN: [id number], DOB: 05/08/1981, 34 y.o. Date of Encounter: 12/15/2014, 4:49 PM  Primary Physician: Rene PaciValerie Leschber, MD  Chief Complaint:  Chief Complaint  Patient presents with  . Cough    x1 mth gets better then worse   . Wheezing    HPI: 34 y.o. year old male presents with a 32 day history of nasal congestion, post nasal drip, sore throat, and cough. Mild sinus pressure. Afebrile. No chills. Nasal congestion thick and green/yellow. Cough is productive of green/yellow sputum and not associated with time of day. Ears feel full, leading to sensation of muffled hearing. Has tried OTC cold preps without success. No GI complaints.   No sick contacts, recent antibiotics, or recent travels.   No leg trauma, sedentary periods, h/o cancer, or tobacco use.  Past Medical History  Diagnosis Date  . GERD (gastroesophageal reflux disease)     resolved  . Heartburn     resolved  . Headache(784.0)   . Enlarged heart      Home Meds: Prior to Admission medications   Medication Sig Start Date End Date Taking? Authorizing Provider  albuterol (PROVENTIL HFA;VENTOLIN HFA) 108 (90 BASE) MCG/ACT inhaler Inhale 2 puffs into the lungs every 6 (six) hours as needed for wheezing or shortness of breath. 12/15/14  Yes Elvina SidleKurt Meiling Hendriks, MD  albuterol (PROVENTIL) (2.5 MG/3ML) 0.083% nebulizer solution Take 3 mLs (2.5 mg total) by nebulization every 4 (four) hours as needed for wheezing or shortness of breath. Patient not taking: Reported on 12/15/2014 12/11/14   Dahlia ClientHannah Muthersbaugh, PA-C  chlorpheniramine-HYDROcodone (TUSSIONEX PENNKINETIC ER) 10-8 MG/5ML LQCR Take 5 mLs by mouth every 12 (twelve) hours as needed for cough (cough). 12/15/14   Elvina SidleKurt Brendy Ficek, MD  predniSONE (DELTASONE) 20 MG tablet Take 3 PO QAM x3days, 2 PO QAM x3days, 1 PO QAM x3days 12/15/14   Elvina SidleKurt Urbano Milhouse, MD    Allergies: No Known Allergies  History   Social History  . Marital Status: Married    Spouse  Name: Gardiner RamusDao    Number of Children: 0  . Years of Education: N/A   Occupational History  . 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 Customer Service Rep     [employer]   Social History Main Topics  . Smoking status: Former Smoker -- 0.50 packs/day for 7 years    Types: Cigarettes  . Smokeless tobacco: Never Used     Comment: also uses vapor/e-cig occasionally   . Alcohol Use: No  . Drug Use: No  . Sexual Activity:    Partners: Female    Pharmacist, hospitalBirth Control/ Protection: Condom   Other Topics Concern  . Not on file   Social History Narrative   Lives with wife, Gardiner RamusDao.   [employer] student in [employer].     Review of Systems: Constitutional: negative for chills, fever, night sweats or weight changes Cardiovascular: negative for chest pain or palpitations Respiratory: negative for hemoptysis, wheezing, or shortness of breath Abdominal: negative for abdominal pain, nausea, vomiting or diarrhea Dermatological: negative for rash Neurologic: negative for headache  He quit smoking 2 weeks ago,  Was seen in ED 4 days ago for same problme Physical Exam: Blood pressure 116/74, pulse 95, temperature 98.1 F (36.7 C), temperature source Oral, resp. rate 18, height 5' 10.5" (1.791 m), weight 267 lb (121.11 kg), SpO2 95 %., Body mass index is 37.76 kg/(m^2). General: Well developed, well nourished, in no acute distress.  Head: Normocephalic, atraumatic, eyes without discharge, sclera non-icteric, nares are congested. Bilateral auditory canals clear, TM's are without perforation, pearly grey with reflective cone of light bilaterally. No sinus TTP. Oral cavity moist, dentition normal. Posterior pharynx with post nasal drip and mild erythema. No peritonsillar abscess or tonsillar exudate. Neck: Supple. No thyromegaly. Full ROM. No lymphadenopathy. Lungs: Coarse breath sounds bilaterally with inspiratory wheezes, but no rales, or rhonchi. Breathing is unlabored.    Heart: RRR with S1 S2. No murmurs, rubs, or gallops appreciated. Msk:  Strength and tone normal for age. Extremities: No clubbing or cyanosis. No edema. Neuro: Alert and oriented X 3. Moves all extremities spontaneously. CNII-XII grossly in tact. Psych:  Responds to questions appropriately with a normal affect.   Labs:   ASSESSMENT AND PLAN:  34 y.o. year old male with bronchitis. -   ICD-9-CM ICD-10-CM   1. Lower respiratory infection 519.8 J98.8 predniSONE (DELTASONE) 20 MG tablet     albuterol (PROVENTIL HFA;VENTOLIN HFA) 108 (90 BASE) MCG/ACT inhaler     chlorpheniramine-HYDROcodone (TUSSIONEX PENNKINETIC ER) 10-8 MG/5ML LQCR  2. Cough 786.2 R05 chlorpheniramine-HYDROcodone (TUSSIONEX PENNKINETIC ER) 10-8 MG/5ML LQCR   -Tylenol/Motrin prn -Rest/fluids -RTC precautions -RTC 3-5 days if no improvement  Signed, Elvina Sidle, MD 12/15/2014 4:49 PM

## 2014-12-24 ENCOUNTER — Ambulatory Visit (HOSPITAL_COMMUNITY): Payer: BLUE CROSS/BLUE SHIELD | Attending: Physician Assistant | Admitting: Radiology

## 2014-12-24 DIAGNOSIS — I517 Cardiomegaly: Secondary | ICD-10-CM | POA: Insufficient documentation

## 2014-12-24 NOTE — Progress Notes (Signed)
Echocardiogram performed.  

## 2014-12-24 NOTE — Addendum Note (Signed)
Addended by: Hinton RaoBREWINGTON, Noemie Devivo R on: 12/24/2014 05:20 PM   Modules accepted: Orders

## 2015-10-07 ENCOUNTER — Ambulatory Visit: Payer: Self-pay | Admitting: Internal Medicine

## 2016-04-14 IMAGING — CR DG CHEST 2V
2 series · 2 of 2 positions shown · non-contrast
Comparison: 11/17/2014

CLINICAL DATA: Productive cough and chest congestion for 12 days,
fatigue, nasal congestion, sore throat, shortness of breath, RIGHT
axillary line chest pain, long crackles, smoker

EXAM:
CHEST  2 VIEW

[PA]
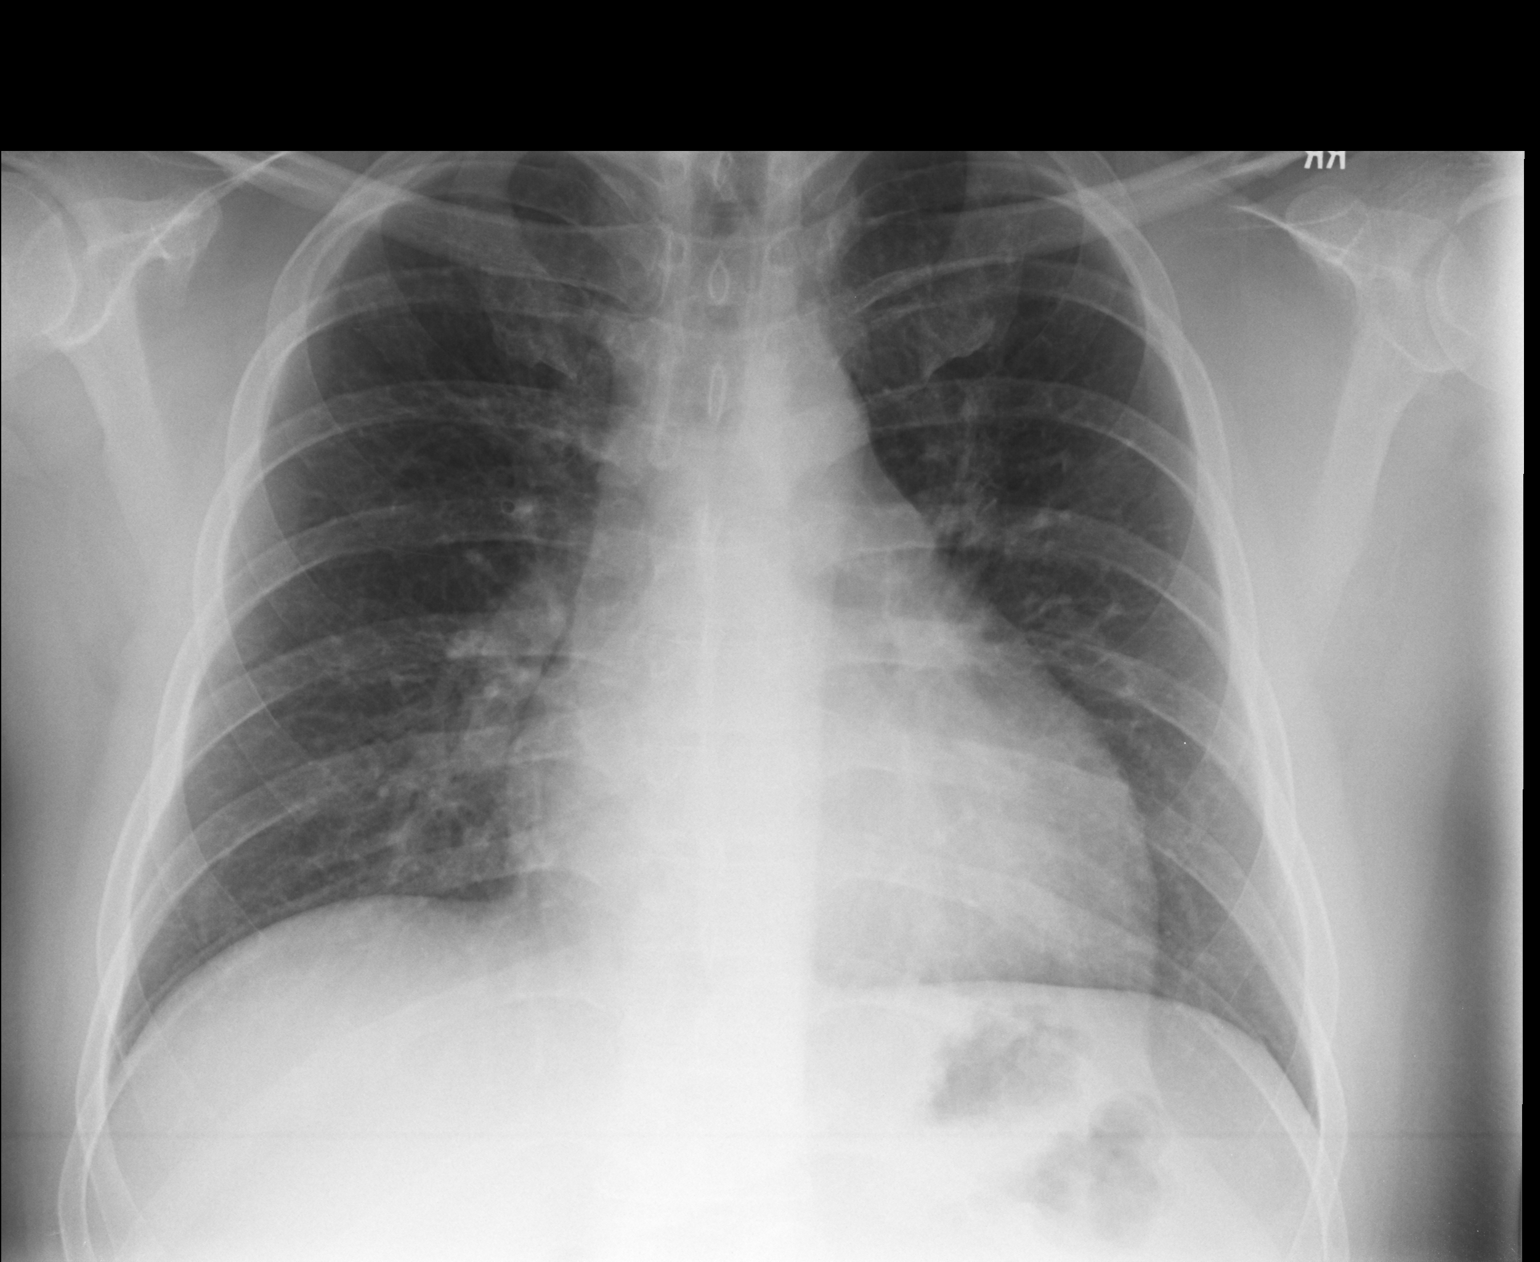

[lateral]
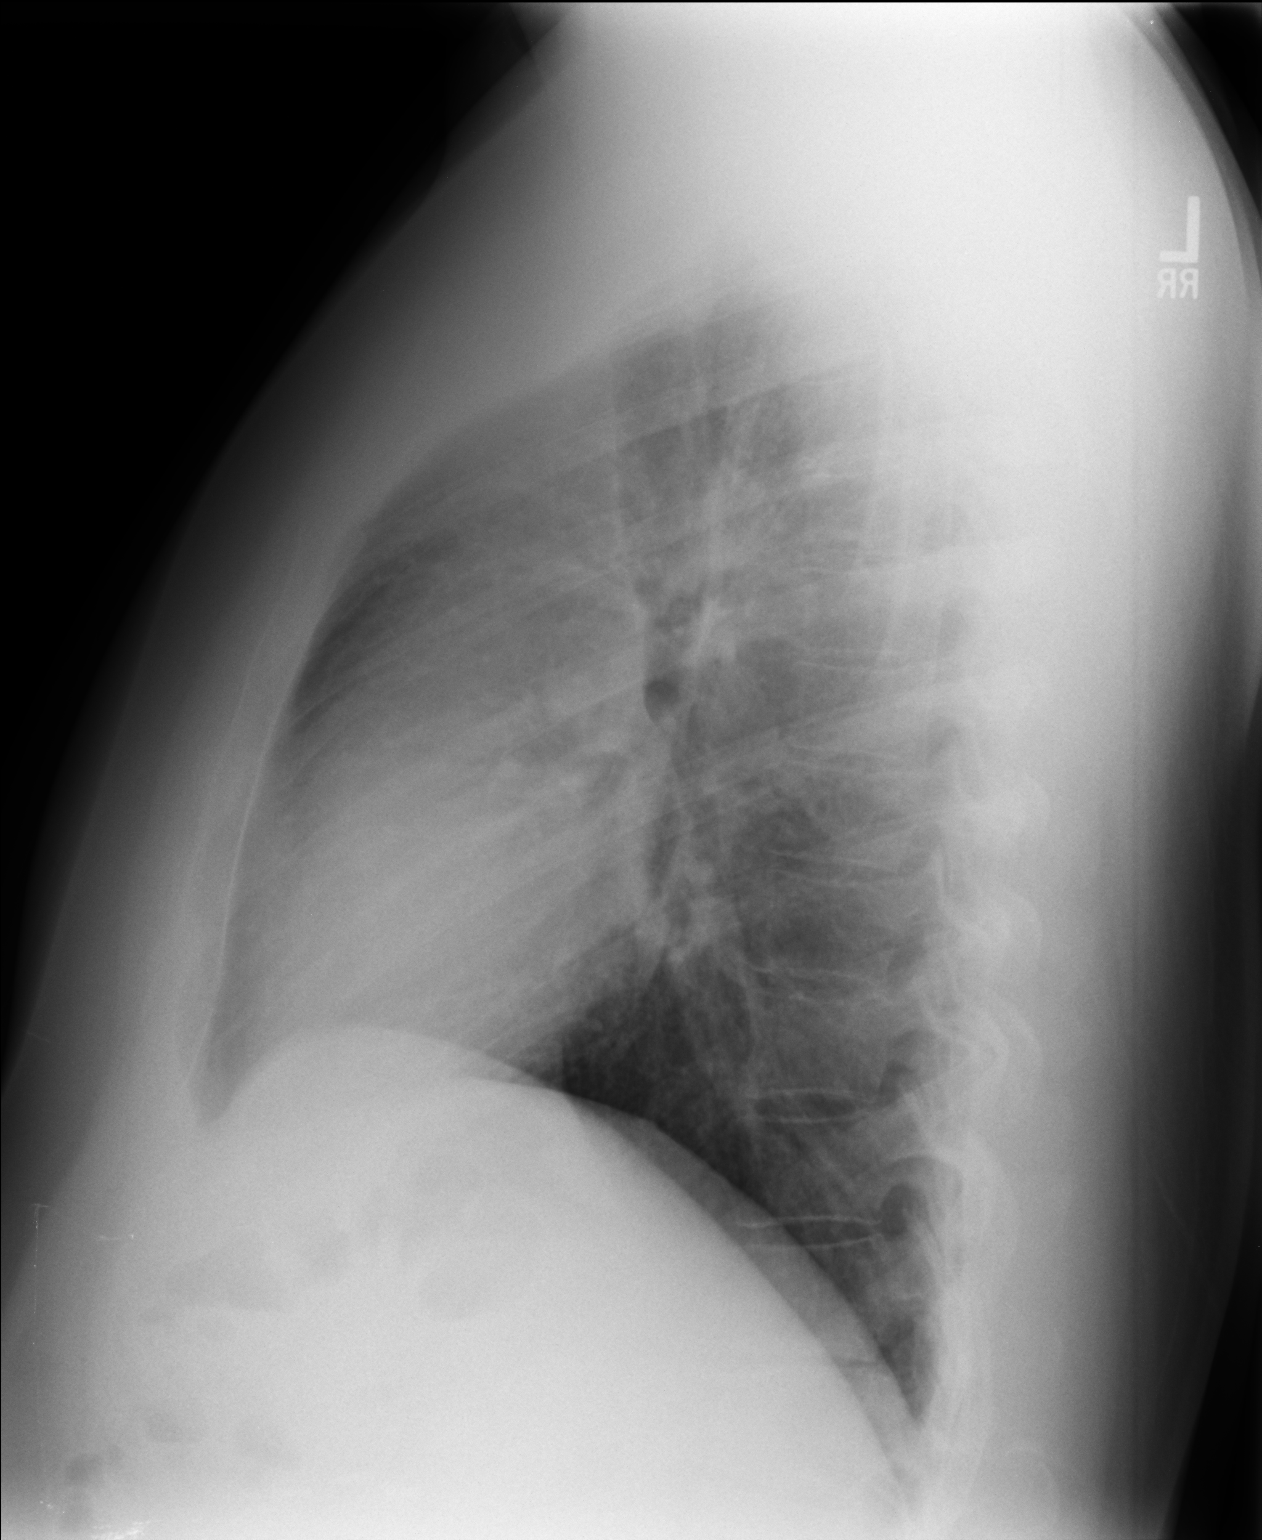

[2 of 2 positions shown; findings below may reference images not displayed]

FINDINGS: Enlargement of cardiac silhouette.

Mediastinal contours and pulmonary vascularity normal.

Lungs clear.

No infiltrate, pleural effusion or pneumothorax.

Bones unremarkable.
IMPRESSION: Enlargement of cardiac silhouette.

No acute abnormalities.

## 2016-04-24 IMAGING — CR DG CHEST 2V
2 series · 2 of 2 positions shown · non-contrast
Comparison: 12/01/2014

CLINICAL DATA: Patient states that he has been short of breath for
past two days and has had upper back pain, Lenny Milne told him he had
viral infection

EXAM:
CHEST  2 VIEW

[w chest pa]
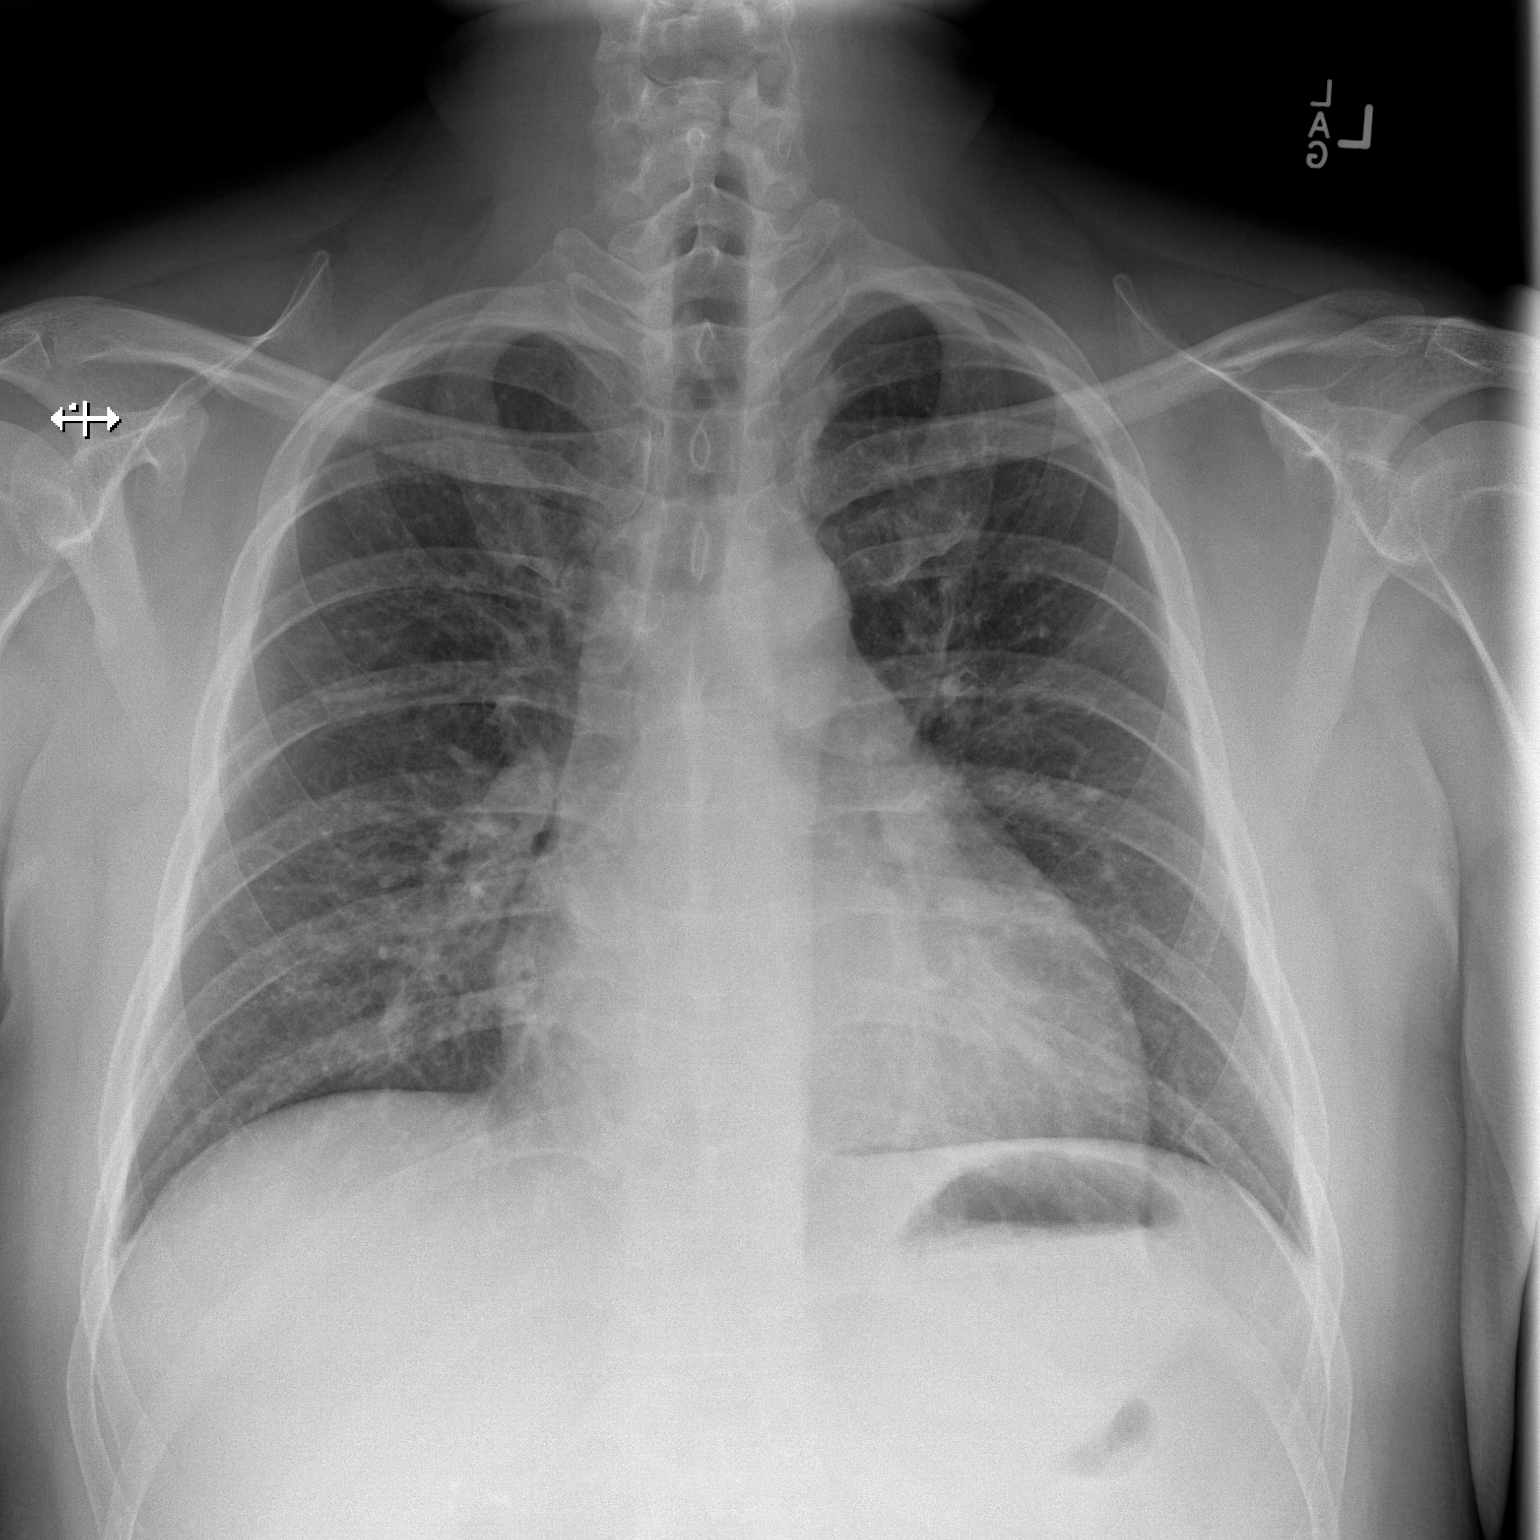

[w chest lat]
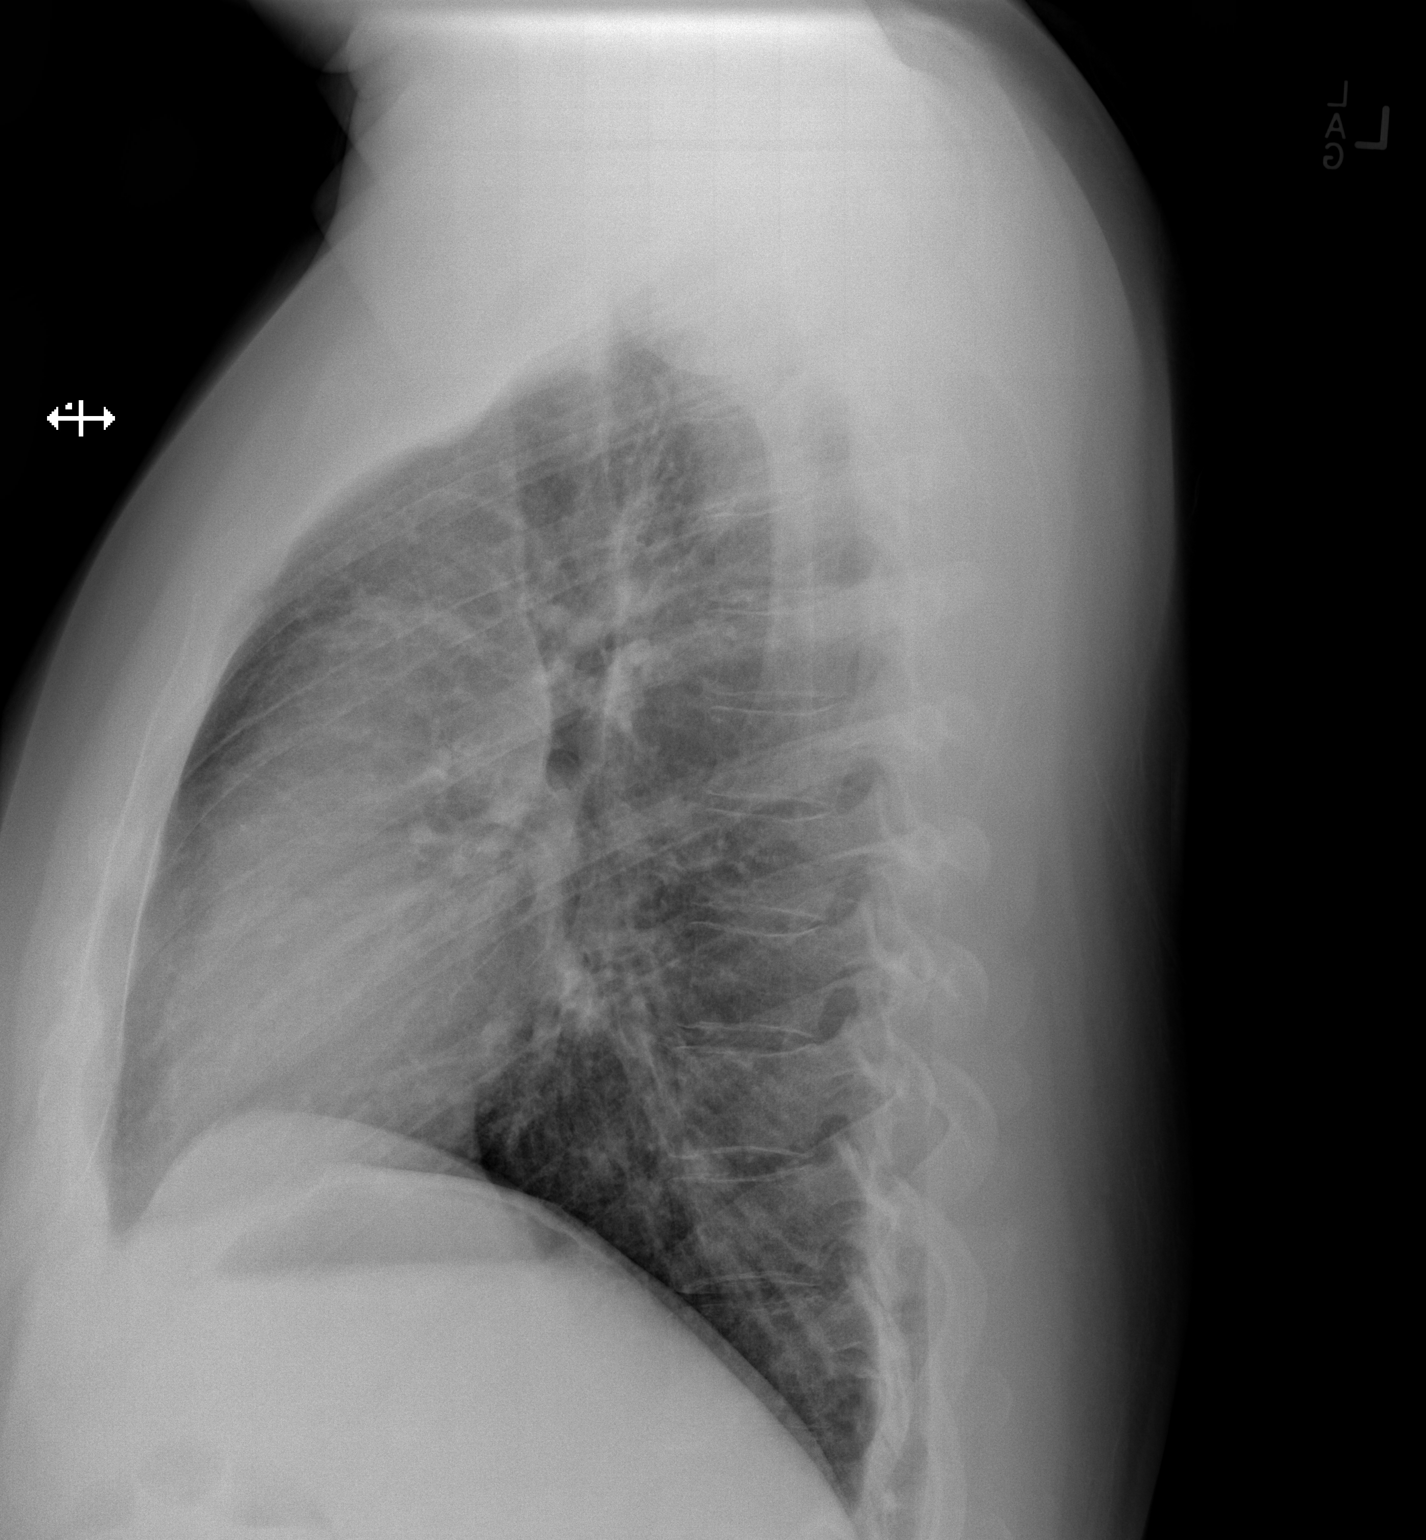

[2 of 2 positions shown; findings below may reference images not displayed]

FINDINGS: Mild central peribronchial interstitial prominence. No lung
consolidation to suggest pneumonia. No pleural effusion or
pneumothorax.

Cardiac silhouette is normal in size. Normal mediastinal and hilar
contours.

Bony thorax is unremarkable.
IMPRESSION: Mild central peribronchovascular interstitial prominence which is
consistent with a viral respiratory infection. No lobar pneumonia.
No pulmonary edema. No other abnormality.
